# Patient Record
Sex: Male | Born: 1935 | Race: White | Hispanic: No | Marital: Single | Smoking: Former smoker
Health system: Southern US, Community
[De-identification: ages and names within clinical notes are randomized; demographics above are authoritative.]

## PROBLEM LIST (undated history)

## (undated) ENCOUNTER — Emergency Department (HOSPITAL_COMMUNITY): Discharge status: Absent without leave | Payer: Self-pay

## (undated) DIAGNOSIS — N400 Enlarged prostate without lower urinary tract symptoms: Secondary | ICD-10-CM

---

## 2015-05-09 ENCOUNTER — Ambulatory Visit: Payer: Self-pay | Admitting: Family Medicine

## 2016-01-24 ENCOUNTER — Encounter (HOSPITAL_COMMUNITY): Payer: Self-pay | Admitting: Emergency Medicine

## 2016-01-24 ENCOUNTER — Emergency Department (HOSPITAL_COMMUNITY)
Admission: EM | Admit: 2016-01-24 | Discharge: 2016-01-24 | Disposition: A | Discharge status: Home / Self Care | Payer: Medicare Other | Attending: Emergency Medicine | Admitting: Emergency Medicine

## 2016-01-24 ENCOUNTER — Emergency Department (HOSPITAL_COMMUNITY): Payer: Medicare Other

## 2016-01-24 DIAGNOSIS — W1839XA Other fall on same level, initial encounter: Secondary | ICD-10-CM | POA: Insufficient documentation

## 2016-01-24 DIAGNOSIS — Z791 Long term (current) use of non-steroidal anti-inflammatories (NSAID): Secondary | ICD-10-CM | POA: Insufficient documentation

## 2016-01-24 DIAGNOSIS — S22080A Wedge compression fracture of T11-T12 vertebra, initial encounter for closed fracture: Secondary | ICD-10-CM | POA: Diagnosis not present

## 2016-01-24 DIAGNOSIS — F17200 Nicotine dependence, unspecified, uncomplicated: Secondary | ICD-10-CM | POA: Diagnosis not present

## 2016-01-24 DIAGNOSIS — Y939 Activity, unspecified: Secondary | ICD-10-CM | POA: Insufficient documentation

## 2016-01-24 DIAGNOSIS — Y929 Unspecified place or not applicable: Secondary | ICD-10-CM | POA: Insufficient documentation

## 2016-01-24 DIAGNOSIS — S32010A Wedge compression fracture of first lumbar vertebra, initial encounter for closed fracture: Secondary | ICD-10-CM | POA: Diagnosis not present

## 2016-01-24 DIAGNOSIS — Y999 Unspecified external cause status: Secondary | ICD-10-CM | POA: Diagnosis not present

## 2016-01-24 DIAGNOSIS — M545 Low back pain, unspecified: Secondary | ICD-10-CM

## 2016-01-24 DIAGNOSIS — Z79899 Other long term (current) drug therapy: Secondary | ICD-10-CM | POA: Insufficient documentation

## 2016-01-24 DIAGNOSIS — IMO0002 Reserved for concepts with insufficient information to code with codable children: Secondary | ICD-10-CM

## 2016-01-24 HISTORY — DX: Benign prostatic hyperplasia without lower urinary tract symptoms: N40.0

## 2016-01-24 LAB — URINE MICROSCOPIC-ADD ON
BACTERIA UA: NONE SEEN
RBC / HPF: NONE SEEN RBC/hpf (ref 0–5)
Squamous Epithelial / LPF: NONE SEEN

## 2016-01-24 LAB — URINALYSIS, ROUTINE W REFLEX MICROSCOPIC
Bilirubin Urine: NEGATIVE
GLUCOSE, UA: NEGATIVE mg/dL
HGB URINE DIPSTICK: NEGATIVE
Ketones, ur: NEGATIVE mg/dL
Nitrite: NEGATIVE
PH: 6 (ref 5.0–8.0)
PROTEIN: NEGATIVE mg/dL
Specific Gravity, Urine: 1.015 (ref 1.005–1.030)

## 2016-01-24 MED ORDER — TRAMADOL HCL 50 MG PO TABS
50.0000 mg | ORAL_TABLET | Freq: Once | ORAL | Status: AC
Start: 2016-01-24 — End: 2016-01-24
  Administered 2016-01-24: 50 mg via ORAL
  Filled 2016-01-24: qty 1

## 2016-01-24 MED ORDER — IBUPROFEN 800 MG PO TABS: 800.0000 mg | ORAL_TABLET | Freq: Three times a day (TID) | ORAL | Status: DC

## 2016-01-24 NOTE — Discharge Instructions (Signed)

## 2016-01-24 NOTE — ED Notes (Signed)
MD at bedside. 

## 2016-01-24 NOTE — ED Notes (Signed)
PT c/o lower back pain and states "I think it's my kidneys." PT states he take flomax and denies any urinary symptoms.

## 2016-01-24 NOTE — ED Provider Notes (Signed)
CSN: [id number]     Arrival date & time 01/24/16  [id number] History  By signing my name below, I, Nicole Kindred, attest that this documentation has been prepared under the direction and in the presence of Noemi Chapel, MD.   Electronically Signed: Nicole Kindred, ED Scribe. 01/24/2016. 10:31 AM   Chief Complaint  Patient presents with  . Back Pain    The history is provided by the patient. No language interpreter was used.   HPI Comments: Tyler Wells is a 81 y.o. male who presents to the Emergency Department complaining of gradual onset, lower back pain, ongoing for three days. Pt states his pain has some minimal radiation to the L abdomen - but only with movement. Pt reports that he fell and landed backwards last week. No other associated symptoms noted. His pain is worse at night when he lays down on his back to go to sleep. And seems worse with change in position - he has never had back pain before. No other worsening or alleviating factors noted. Pt denies numbness, tingling, weakness, dysuria, difficulty urinating, hx of cancer, cough, fever, chills, shortness of breath, headache, hematochezia, constipation, or any other pertinent symptoms.   Past Medical History  Diagnosis Date  . Enlarged prostate    History reviewed. No pertinent past surgical history. History reviewed. No pertinent family history. Social History  Substance Use Topics  . Smoking status: Current Every Day Smoker -- 0.10 packs/day  . Smokeless tobacco: None  . Alcohol Use: No    Review of Systems  Constitutional: Negative for fever and chills.  Respiratory: Negative for cough and shortness of breath.   Gastrointestinal: Negative for constipation and blood in stool.  Genitourinary: Negative for dysuria and difficulty urinating.  Musculoskeletal: Positive for back pain.  Neurological: Negative for weakness, numbness and headaches.  All other systems reviewed and are negative.    Allergies  Review  of patient's allergies indicates no known allergies.  Home Medications   Prior to Admission medications   Medication Sig Start Date End Date Taking? Authorizing Provider  omeprazole (PRILOSEC) 20 MG capsule Take 20 mg by mouth daily.  01/22/16  Yes Historical Provider, MD  tamsulosin (FLOMAX) 0.4 MG CAPS capsule Take 0.4 mg by mouth daily. 12/05/15  Yes Historical Provider, MD  tiZANidine (ZANAFLEX) 4 MG tablet Take 4 mg by mouth 3 (three) times daily.  11/20/15 11/19/16 Yes Historical Provider, MD  traMADol (ULTRAM) 50 MG tablet Take 50 mg by mouth 2 (two) times daily.  01/22/16  Yes Historical Provider, MD  ibuprofen (ADVIL,MOTRIN) 800 MG tablet Take 1 tablet (800 mg total) by mouth 3 (three) times daily. 01/24/16   Noemi Chapel, MD   BP 125/72 mmHg  Pulse 97  Temp(Src) 98.2 F (36.8 C) (Oral)  Resp 16  Ht 5\' 6"  (1.676 m)  Wt 156 lb (70.761 kg)  BMI 25.19 kg/m2  SpO2 97% Physical Exam  Constitutional: He appears well-developed and well-nourished. No distress.  HENT:  Head: Normocephalic and atraumatic.  Mouth/Throat: Oropharynx is clear and moist. No oropharyngeal exudate.  Eyes: Conjunctivae and EOM are normal. Pupils are equal, round, and reactive to light. Right eye exhibits no discharge. Left eye exhibits no discharge. No scleral icterus.  Neck: Normal range of motion. Neck supple. No JVD present. No thyromegaly present.  Cardiovascular: Normal rate, regular rhythm, normal heart sounds and intact distal pulses.  Exam reveals no gallop and no friction rub.   No murmur heard. Pulmonary/Chest: Effort normal and breath  sounds normal. No respiratory distress. He has no wheezes. He has no rales.  Abdominal: Soft. Bowel sounds are normal. He exhibits no distension and no mass. There is no tenderness.  No pulsating mass. Soft, non-tender abdomen.  Musculoskeletal: Normal range of motion. He exhibits tenderness ( no spinal ttp - has bilateral paraspinal ttp in the lumbar spine.). He exhibits  no edema.  Lymphadenopathy:    He has no cervical adenopathy.  Neurological: He is alert. Coordination normal.  Has normal gait, normal strength of the bilateral UE and LE's and no ataxia, speech is normal.  Skin: Skin is warm and dry. No rash noted. No erythema.  Psychiatric: He has a normal mood and affect. His behavior is normal.  Nursing note and vitals reviewed.   ED Course  Procedures (including critical care time) DIAGNOSTIC STUDIES: Oxygen Saturation is 97% on RA, normal by my interpretation.    COORDINATION OF CARE: 8:58 AM Discussed treatment plan with pt at bedside and pt agreed to plan.   Labs Review Labs Reviewed  URINALYSIS, ROUTINE W REFLEX MICROSCOPIC (NOT AT [facility]) - Abnormal; Notable for the following:    Leukocytes, UA TRACE (*)    All other components within normal limits  URINE CULTURE  URINE MICROSCOPIC-ADD ON    Imaging Review Dg Lumbar Spine Complete  01/24/2016  CLINICAL DATA:  Patient with low back pain.  No known injury. EXAM: LUMBAR SPINE - COMPLETE 4+ VIEW COMPARISON:  None. FINDINGS: Mild rightward curvature of the lumbar spine. Age-indeterminate height loss of the superior endplate of the 624THL and L1 vertebral bodies. Relative preservation the intervertebral disc space heights. L5-S1 degenerative disc disease. L4-5 and L5-S1 facet degenerative changes. Aortic vascular calcifications. SI joints are unremarkable. IMPRESSION: Age-indeterminate height loss of the superior endplate of the 624THL and L1 vertebral bodies. Recommend correlation point tenderness. Lower lumbar spine facet and disc degenerative changes. Electronically Signed   By: Lovey Newcomer M.D.   On: 01/24/2016 09:28   I have personally reviewed and evaluated these images and lab results as part of my medical decision-making.    MDM   Final diagnoses:  Bilateral low back pain without sciatica  Compression fracture   The pt appears overal well, he has a recent fall and now has ttp in the  paraspinal area without midline ttp - however, he has had a recent fall raising concern for possible frx of the lumbar spine - will check L spine series and UA - no signs of AAA, pain is reproducible with movement and position raising suspician for muskuloskeletal source of pain rather than neurological / or intraabdominal.  UA also ordered.  Possible endplate frx / [id number]-GRM, otherwise normal xray UA neg Pt and family informed  I personally performed the services described in this documentation, which was scribed in my presence. The recorded information has been reviewed and is accurate.     Meds given in ED:  Medications  traMADol (ULTRAM) tablet 50 mg (50 mg Oral Given 01/24/16 0922)    New Prescriptions   IBUPROFEN (ADVIL,MOTRIN) 800 MG TABLET    Take 1 tablet (800 mg total) by mouth 3 (three) times daily.          Noemi Chapel, MD 01/24/16 1031

## 2016-01-25 LAB — URINE CULTURE: Culture: NO GROWTH

## 2017-02-26 ENCOUNTER — Encounter (HOSPITAL_COMMUNITY): Payer: Self-pay

## 2017-02-26 ENCOUNTER — Emergency Department (HOSPITAL_COMMUNITY): Payer: Medicare Other

## 2017-02-26 ENCOUNTER — Emergency Department (HOSPITAL_COMMUNITY)
Admission: EM | Admit: 2017-02-26 | Discharge: 2017-02-26 | Disposition: A | Discharge status: Home / Self Care | Payer: Medicare Other | Attending: Emergency Medicine | Admitting: Emergency Medicine

## 2017-02-26 DIAGNOSIS — Z79899 Other long term (current) drug therapy: Secondary | ICD-10-CM | POA: Diagnosis not present

## 2017-02-26 DIAGNOSIS — M25512 Pain in left shoulder: Secondary | ICD-10-CM | POA: Diagnosis present

## 2017-02-26 DIAGNOSIS — M25511 Pain in right shoulder: Secondary | ICD-10-CM | POA: Insufficient documentation

## 2017-02-26 DIAGNOSIS — M19012 Primary osteoarthritis, left shoulder: Secondary | ICD-10-CM

## 2017-02-26 DIAGNOSIS — F17210 Nicotine dependence, cigarettes, uncomplicated: Secondary | ICD-10-CM | POA: Diagnosis not present

## 2017-02-26 MED ORDER — HYDROCODONE-ACETAMINOPHEN 5-325 MG PO TABS: ORAL_TABLET | ORAL | 0 refills | Status: DC

## 2017-02-26 MED ORDER — DEXAMETHASONE SODIUM PHOSPHATE 4 MG/ML IJ SOLN
8.0000 mg | Freq: Once | INTRAMUSCULAR | Status: AC
  Administered 2017-02-26: 8 mg via INTRAMUSCULAR
  Filled 2017-02-26: qty 2

## 2017-02-26 NOTE — ED Triage Notes (Signed)
Pt reports bilateral shoulder pain, shoulders hurt all the time. Reports he has difficulty picking up objects. No injury

## 2017-02-26 NOTE — Discharge Instructions (Signed)
Your vital signs are within normal limits. Your examination suggest arthritis related pain in the right shoulder. You were treated in the emergency department with a Decadron shot. Please use Tylenol for mild pain, may use Norco at bedtime for more severe pain. This medication may cause drowsiness, please use it with caution. Please see Dr. Aline Brochure for orthopedic evaluation and management of your shoulder.

## 2017-02-26 NOTE — ED Provider Notes (Signed)
[facility] DEPT Provider Note   CSN: [id number] Arrival date & time: 02/26/17  1305     History   Chief Complaint Chief Complaint  Patient presents with  . Shoulder Pain    HPI Tyler Wells is a 82 y.o. male.  Patient is an 82 year old male who presents to the emergency department with a complaint of shoulder pain.  The patient complains of pain in both shoulders. This started after a fall about 3 or 4 weeks ago. The patient states that he had x-rays done and there was no fracture at that time. He states however he's been having increasing problems with the shoulders. Most recently he has difficulty with picking up of areas objects and with raising his right greater than his left arm. He has not noted any hot joints appreciated. He's not had any more falls after the one that was 3 or 4 weeks ago. He's not had any unusual weakness of any one side. He presents now because he states that the pain is gradually getting worse. He particularly notices problems at bedtime.      Past Medical History:  Diagnosis Date  . Enlarged prostate     There are no active problems to display for this patient.   History reviewed. No pertinent surgical history.     Home Medications    Prior to Admission medications   Medication Sig Start Date End Date Taking? Authorizing Provider  omeprazole (PRILOSEC) 20 MG capsule Take 1 capsule by mouth daily. 08/26/16  Yes [provider]  tamsulosin (FLOMAX) 0.4 MG CAPS capsule Take 0.4 mg by mouth daily. 12/05/15  Yes [provider]  HYDROcodone-acetaminophen (NORCO/VICODIN) 5-325 MG tablet 1 po at hs, or q6h prn. 02/26/17   Lily Kocher, PA-C    Family History No family history on file.  Social History Social History  Substance Use Topics  . Smoking status: Current Every Day Smoker    Packs/day: 0.10  . Smokeless tobacco: Never Used  . Alcohol use No     Allergies   Patient has no known allergies.   Review of  Systems Review of Systems  Constitutional: Negative for activity change.       All ROS Neg except as noted in HPI  HENT: Negative for nosebleeds.   Eyes: Negative for photophobia and discharge.  Respiratory: Negative for cough, shortness of breath and wheezing.   Cardiovascular: Negative for chest pain and palpitations.  Gastrointestinal: Negative for abdominal pain and blood in stool.  Genitourinary: Negative for dysuria, frequency and hematuria.  Musculoskeletal: Positive for arthralgias and back pain. Negative for neck pain.  Skin: Negative.   Neurological: Negative for dizziness, seizures and speech difficulty.  Psychiatric/Behavioral: Negative for confusion and hallucinations.     Physical Exam Updated Vital Signs BP (!) 146/61 (BP Location: Right Arm)   Pulse 95   Temp (!) 97.3 F (36.3 C) (Oral)   Resp 18   Wt 70.8 kg (156 lb)   SpO2 100%   BMI 25.18 kg/m   Physical Exam  Constitutional: He is oriented to person, place, and time. He appears well-developed and well-nourished.  Non-toxic appearance.  HENT:  Head: Normocephalic.  Right Ear: Tympanic membrane and external ear normal.  Left Ear: Tympanic membrane and external ear normal.  Eyes: EOM and lids are normal. Pupils are equal, round, and reactive to light.  Neck: Normal range of motion. Neck supple. Carotid bruit is not present.  Cardiovascular: Normal rate, regular rhythm, intact distal pulses and  normal pulses.   Murmur heard.  Systolic murmur is present with a grade of 2/6  Pulmonary/Chest: Breath sounds normal. No respiratory distress.  Abdominal: Soft. Bowel sounds are normal. There is no tenderness. There is no guarding.  Musculoskeletal:       Right shoulder: He exhibits decreased range of motion, crepitus and pain. He exhibits no deformity.       Left shoulder: He exhibits tenderness and crepitus.  Lymphadenopathy:       Head (right side): No submandibular adenopathy present.       Head (left side):  No submandibular adenopathy present.    He has no cervical adenopathy.  Neurological: He is alert and oriented to person, place, and time. He has normal strength. No cranial nerve deficit or sensory deficit.  Skin: Skin is warm and dry.  Psychiatric: He has a normal mood and affect. His speech is normal.  Nursing note and vitals reviewed.    ED Treatments / Results  Labs (all labs ordered are listed, but only abnormal results are displayed) Labs Reviewed - No data to display  EKG  EKG Interpretation None       Radiology No results found.  Procedures Procedures (including critical care time)  Medications Ordered in ED Medications  dexamethasone (DECADRON) injection 8 mg (not administered)     Initial Impression / Assessment and Plan / ED Course  I have reviewed the triage vital signs and the nursing notes.  Pertinent labs & imaging results that were available during my care of the patient were reviewed by me and considered in my medical decision making (see chart for details).       Final Clinical Impressions(s) / ED Diagnoses MDM Vital signs reviewed.  X-ray of the right shoulder show some glenohumeral degenerative changes but no acute fracture or dislocation.  Suspect the patient has exacerbation of the arthritis involving the right shoulder greater than the left. The patient is asked to use Tylenol for mild pain. He is given a prescription for Norco for more severe pain. Have advised the patient to use caution when taking the Norco. He will follow-up with his primary physician in the office.    Final diagnoses:  Acute pain of right shoulder  Primary osteoarthritis of left shoulder    New Prescriptions New Prescriptions   HYDROCODONE-ACETAMINOPHEN (NORCO/VICODIN) 5-325 MG TABLET    1 po at hs, or q6h prn.     Lily Kocher, PA-C 02/26/17 1514    Julianne Rice, MD 03/01/17 [phone]

## 2017-03-16 ENCOUNTER — Encounter: Payer: Self-pay | Admitting: Orthopedic Surgery

## 2017-03-16 ENCOUNTER — Ambulatory Visit (INDEPENDENT_AMBULATORY_CARE_PROVIDER_SITE_OTHER): Payer: Medicare Other | Admitting: Orthopedic Surgery

## 2017-03-16 VITALS — BP 143/76 | HR 57 | Wt 159.0 lb

## 2017-03-16 DIAGNOSIS — M7552 Bursitis of left shoulder: Secondary | ICD-10-CM | POA: Diagnosis not present

## 2017-03-16 DIAGNOSIS — M7551 Bursitis of right shoulder: Secondary | ICD-10-CM

## 2017-03-16 MED ORDER — NAPROXEN 500 MG PO TABS: 500.0000 mg | ORAL_TABLET | Freq: Two times a day (BID) | ORAL | 2 refills | Status: DC

## 2017-03-16 NOTE — Patient Instructions (Signed)

## 2017-03-16 NOTE — Progress Notes (Signed)
NEW PATIENT OFFICE VISIT    Chief Complaint  Patient presents with  . Shoulder Pain    82 year old male presents for evaluation of bilateral shoulder pain right greater than left  The patient gives a one-month history of progressive pain right greater than left shoulder but pain in both shoulders. 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 When he rolls onto his right side wakes him up from sleep. He describes a dull constant aching. Acromial pain in both shoulders    Review of Systems  Constitutional: Negative for fever.  Respiratory: Negative for shortness of breath.   Cardiovascular: Positive for chest pain.  Skin: Negative.   Neurological: Negative for tingling and sensory change.     Past Medical History:  Diagnosis Date  . Enlarged prostate     No past surgical history on file.  No family history on file. Social History  Substance Use Topics  . Smoking status: Current Every Day Smoker    Packs/day: 0.10  . Smokeless tobacco: Never Used  . Alcohol use No    BP (!) 143/76   Pulse (!) 57   Wt 159 lb (72.1 kg)   BMI 25.66 kg/m   Physical Exam  Constitutional: He is oriented to person, place, and time. He appears well-developed and well-nourished.  Vital signs have been reviewed and are stable. Gen. appearance the patient is well-developed and well-nourished with normal grooming and hygiene.   Musculoskeletal:  Gait is normal  Neurological: He is alert and oriented to person, place, and time.  Skin: Skin is warm and dry. No erythema.  Psychiatric: He has a normal mood and affect.  Vitals reviewed.   Left Shoulder Exam   Tenderness  The patient is experiencing tenderness in the [facility] acromial tenderness.  Range of Motion  Active Abduction:                       90 Passive Abduction:                    110 Forward Flexion:                         150  Muscle Strength  Normal left shoulder strength Supraspinatus:     5/5  Tests  Impingement:   Negative Drop Arm:        Negative Apprehension: Negative Sulcus:            Negative  Right Shoulder Exam   Tenderness  The patient is experiencing tenderness in the Peri-acromial tenderness.  Range of Motion  Active Abduction:                       90 Passive Abduction:                    110 Forward Flexion:                        150  Muscle Strength  Normal right shoulder strength Supraspinatus:     5/5  Tests  Impingement:   Positive Drop Arm:        Negative Apprehension: Negative Sulcus:            Negative  Comments:  Neurovascular exam intact    X-ray  AP lateral and axillary x-ray right shoulder I see that this does show mild degenerative arthritis. No fracture dislocation.  Report was read as follows IMPRESSION: No evidence of acute abnormality.     Electronically Signed   By: Margarette Canada M.D.   On: 02/26/2017 14:49  Encounter Diagnosis  Name Primary?  . Bilateral shoulder bursitis Yes    PLAN:   Meds ordered this encounter  Medications  . naproxen (NAPROSYN) 500 MG tablet    Sig: Take 1 tablet (500 mg total) by mouth 2 (two) times daily with a meal.    Dispense:  60 tablet    Refill:  2   Procedure note the subacromial injection shoulder Left  Verbal consent was obtained to inject the  left  Shoulder  Timeout was completed to confirm the injection site is a subacromial space of the  left shoulder   Medication used Depo-Medrol 40 mg and lidocaine 1% 3 cc  Anesthesia was provided by ethyl chloride  The injection was performed in the left posterior subacromial space. After pinning the skin with alcohol and anesthetized the skin with ethyl chloride the subacromial space was injected using a 20-gauge needle. There were no complications  Sterile dressing was applied.  I then repeated the exact procedure on the right shoulder. 40 mg of  Depo-Medrol and 1% lidocaine 3 mL  .

## 2017-11-04 ENCOUNTER — Other Ambulatory Visit: Payer: Self-pay

## 2017-11-04 ENCOUNTER — Emergency Department (HOSPITAL_COMMUNITY): Payer: Medicare Other

## 2017-11-04 ENCOUNTER — Encounter (HOSPITAL_COMMUNITY): Payer: Self-pay | Admitting: Emergency Medicine

## 2017-11-04 ENCOUNTER — Emergency Department (HOSPITAL_COMMUNITY)
Admission: EM | Admit: 2017-11-04 | Discharge: 2017-11-04 | Disposition: A | Discharge status: Home / Self Care | Payer: Medicare Other | Attending: Emergency Medicine | Admitting: Emergency Medicine

## 2017-11-04 DIAGNOSIS — S2242XA Multiple fractures of ribs, left side, initial encounter for closed fracture: Secondary | ICD-10-CM | POA: Insufficient documentation

## 2017-11-04 DIAGNOSIS — S299XXA Unspecified injury of thorax, initial encounter: Secondary | ICD-10-CM | POA: Diagnosis present

## 2017-11-04 DIAGNOSIS — Y929 Unspecified place or not applicable: Secondary | ICD-10-CM | POA: Insufficient documentation

## 2017-11-04 DIAGNOSIS — Z87891 Personal history of nicotine dependence: Secondary | ICD-10-CM | POA: Diagnosis not present

## 2017-11-04 DIAGNOSIS — Y939 Activity, unspecified: Secondary | ICD-10-CM | POA: Diagnosis not present

## 2017-11-04 DIAGNOSIS — Y998 Other external cause status: Secondary | ICD-10-CM | POA: Insufficient documentation

## 2017-11-04 DIAGNOSIS — W109XXA Fall (on) (from) unspecified stairs and steps, initial encounter: Secondary | ICD-10-CM | POA: Diagnosis not present

## 2017-11-04 MED ORDER — HYDROCODONE-ACETAMINOPHEN 5-325 MG PO TABS
1.0000 | ORAL_TABLET | Freq: Once | ORAL | Status: AC
  Administered 2017-11-04: 1 via ORAL
  Filled 2017-11-04: qty 1

## 2017-11-04 MED ORDER — HYDROCODONE-ACETAMINOPHEN 7.5-325 MG PO TABS: 1.0000 | ORAL_TABLET | Freq: Four times a day (QID) | ORAL | 0 refills | Status: DC | PRN

## 2017-11-04 NOTE — ED Triage Notes (Signed)
Pt states he fell a couple weeks ago and is having L sided rib pain. Denies being seeing for this before now, no blood thinner use.

## 2017-11-04 NOTE — Discharge Instructions (Signed)
Use a pillow to hold firm pressure to cough and take deep breaths throughout the day.  Call Dr. Juel Burrow office to arrange a follow-up appt for next week.  Return here for any worsening symptoms such as coughing up blood, sudden shortness of breath or increasing pain

## 2017-11-04 NOTE — ED Provider Notes (Signed)
[facility] EMERGENCY DEPARTMENT Provider Note   CSN: [id number] Arrival date & time: 11/04/17  1212     History   Chief Complaint Chief Complaint  Patient presents with  . rib pain    HPI Tyler Wells is a 83 y.o. male.  HPI   Tyler Wells is a 83 y.o. male who presents to the Emergency Department complaining of left rib pain for 2 weeks.  Patient states that he was drinking alcohol and fell down two steps landing on his left chest.  He describes a sharp pain to his chest associated with deep breathing, laughing and coughing.  Pain improves somewhat at rest.  He has taken tylenol and ibuprofen without improvement.  He denies shortness of breath, abdominal pain, flank pain, head injury , neck or back pain and LOC.  Denies use of blood thinners   Past Medical History:  Diagnosis Date  . Enlarged prostate     There are no active problems to display for this patient.   History reviewed. No pertinent surgical history.   Home Medications    Prior to Admission medications   Medication Sig Start Date End Date Taking? Authorizing Provider  HYDROcodone-acetaminophen (NORCO) 7.5-325 MG tablet Take 1 tablet by mouth every 6 (six) hours as needed for moderate pain. 11/04/17   Elie Gragert, PA-C  naproxen (NAPROSYN) 500 MG tablet Take 1 tablet (500 mg total) by mouth 2 (two) times daily with a meal. 03/16/17   Carole Civil, MD  tamsulosin (FLOMAX) 0.4 MG CAPS capsule Take 0.4 mg by mouth daily. 12/05/15   [provider]    Family History History reviewed. No pertinent family history.  Social History Social History   Tobacco Use  . Smoking status: Former Smoker    Packs/day: 0.10  . Smokeless tobacco: Never Used  Substance Use Topics  . Alcohol use: No  . Drug use: No     Allergies   Patient has no known allergies.   Review of Systems Review of Systems  Constitutional: Negative for chills and fever.  Respiratory: Negative for cough,  chest tightness and shortness of breath.   Cardiovascular: Positive for chest pain (left rib pain).  Gastrointestinal: Negative for abdominal pain, diarrhea, nausea and vomiting.  Genitourinary: Negative for difficulty urinating, dysuria and hematuria.  Musculoskeletal: Negative for arthralgias, back pain, joint swelling and neck pain.  Skin: Negative for color change and wound.  Neurological: Negative for dizziness, syncope, weakness and headaches.  All other systems reviewed and are negative.    Physical Exam Updated Vital Signs BP 128/65 (BP Location: Right Arm)   Pulse 82   Temp 97.7 F (36.5 C) (Oral)   Resp 16   Ht 5\' 7"  (1.702 m)   Wt 73.9 kg (163 lb)   SpO2 95%   BMI 25.53 kg/m   Physical Exam  Constitutional: He is oriented to person, place, and time. He appears well-developed and well-nourished. No distress.  HENT:  Head: Atraumatic.  Mouth/Throat: Oropharynx is clear and moist.  Eyes: EOM are normal. Pupils are equal, round, and reactive to light.  Neck: Normal range of motion. Neck supple.  Cardiovascular: Normal rate, regular rhythm and intact distal pulses.  Pulmonary/Chest: Effort normal and breath sounds normal. No respiratory distress. He exhibits tenderness (focal ttp of the lateral left ribs.  no ecchymosis or edema.  ).  Abdominal: Soft. He exhibits no distension and no mass. There is no tenderness. There is no guarding.  Musculoskeletal: Normal range  of motion.  Lymphadenopathy:    He has no cervical adenopathy.  Neurological: He is alert and oriented to person, place, and time. No sensory deficit.  Skin: Skin is warm. Capillary refill takes less than 2 seconds.  Nursing note and vitals reviewed.    ED Treatments / Results  Labs (all labs ordered are listed, but only abnormal results are displayed) Labs Reviewed - No data to display  EKG  EKG Interpretation None       Radiology Dg Ribs Unilateral W/chest Left  Result Date:  11/04/2017 CLINICAL DATA:  Left rib pain after fall EXAM: LEFT RIBS AND CHEST - 3+ VIEW COMPARISON:  None. FINDINGS: Low lung volumes. Normal heart size. Calcified granulomatous nodes in right hilum and right lower mediastinum. Otherwise normal mediastinal contour. No pneumothorax. No pleural effusion. Mild-to-moderate bibasilar atelectasis. No pulmonary edema. Mildly displaced acute lateral left fourth, fifth, sixth and seventh rib fractures, including comminuted lateral left seventh rib fractures. No suspicious focal osseous lesions. IMPRESSION: 1. Mildly displaced acute lateral left fourth through seventh rib fractures. 2. No pneumothorax. 3. Mild-to-moderate bibasilar atelectasis. Electronically Signed   By: Ilona Sorrel M.D.   On: 11/04/2017 13:12    Procedures Procedures (including critical care time)  Medications Ordered in ED Medications  HYDROcodone-acetaminophen (NORCO/VICODIN) 5-325 MG per tablet 1 tablet (1 tablet Oral Given 11/04/17 1248)     Initial Impression / Assessment and Plan / ED Course  I have reviewed the triage vital signs and the nursing notes.  Pertinent labs & imaging results that were available during my care of the patient were reviewed by me and considered in my medical decision making (see chart for details).     Pt also seen by Dr. Roderic Palau and care plan discussed.    XR shows fx's of multiple ribs w/o pneumo.  Pt is ambulatory.  No hypoxia or tachypnea.  Feeling better after po medication.  Discussed home therapies and importance of close PCP f/u.  ER return precautions also discussed.    Final Clinical Impressions(s) / ED Diagnoses   Final diagnoses:  Closed fracture of multiple ribs of left side, initial encounter    ED Discharge Orders        Ordered    HYDROcodone-acetaminophen (NORCO) 7.5-325 MG tablet  Every 6 hours PRN     11/04/17 1352       Kem Parkinson, PA-C 11/05/17 1711    Milton Ferguson, MD 11/06/17 1253

## 2017-11-04 NOTE — ED Notes (Signed)
Patient seen and evaluated by EDPa for initial assessment. 

## 2018-03-03 DIAGNOSIS — N400 Enlarged prostate without lower urinary tract symptoms: Secondary | ICD-10-CM | POA: Diagnosis not present

## 2018-03-03 DIAGNOSIS — Z6824 Body mass index (BMI) 24.0-24.9, adult: Secondary | ICD-10-CM | POA: Diagnosis not present

## 2018-03-03 DIAGNOSIS — M25511 Pain in right shoulder: Secondary | ICD-10-CM | POA: Diagnosis not present

## 2018-03-03 DIAGNOSIS — R0781 Pleurodynia: Secondary | ICD-10-CM | POA: Diagnosis not present

## 2018-03-03 DIAGNOSIS — G4700 Insomnia, unspecified: Secondary | ICD-10-CM | POA: Diagnosis not present

## 2018-04-28 DIAGNOSIS — H6123 Impacted cerumen, bilateral: Secondary | ICD-10-CM | POA: Diagnosis not present

## 2018-04-28 DIAGNOSIS — M791 Myalgia, unspecified site: Secondary | ICD-10-CM | POA: Diagnosis not present

## 2018-04-28 DIAGNOSIS — H9190 Unspecified hearing loss, unspecified ear: Secondary | ICD-10-CM | POA: Diagnosis not present

## 2018-05-14 DIAGNOSIS — M791 Myalgia, unspecified site: Secondary | ICD-10-CM | POA: Diagnosis not present

## 2018-05-14 DIAGNOSIS — Z0001 Encounter for general adult medical examination with abnormal findings: Secondary | ICD-10-CM | POA: Diagnosis not present

## 2018-05-14 DIAGNOSIS — Z7689 Persons encountering health services in other specified circumstances: Secondary | ICD-10-CM | POA: Diagnosis not present

## 2018-05-14 DIAGNOSIS — Z6824 Body mass index (BMI) 24.0-24.9, adult: Secondary | ICD-10-CM | POA: Diagnosis not present

## 2018-05-14 DIAGNOSIS — R0781 Pleurodynia: Secondary | ICD-10-CM | POA: Diagnosis not present

## 2018-05-14 DIAGNOSIS — M25511 Pain in right shoulder: Secondary | ICD-10-CM | POA: Diagnosis not present

## 2018-05-14 DIAGNOSIS — K219 Gastro-esophageal reflux disease without esophagitis: Secondary | ICD-10-CM | POA: Diagnosis not present

## 2018-05-14 DIAGNOSIS — S2232XA Fracture of one rib, left side, initial encounter for closed fracture: Secondary | ICD-10-CM | POA: Diagnosis not present

## 2018-07-06 DIAGNOSIS — M6208 Separation of muscle (nontraumatic), other site: Secondary | ICD-10-CM | POA: Diagnosis not present

## 2018-07-06 DIAGNOSIS — K210 Gastro-esophageal reflux disease with esophagitis: Secondary | ICD-10-CM | POA: Diagnosis not present

## 2018-07-06 DIAGNOSIS — M791 Myalgia, unspecified site: Secondary | ICD-10-CM | POA: Diagnosis not present

## 2018-07-06 DIAGNOSIS — H9193 Unspecified hearing loss, bilateral: Secondary | ICD-10-CM | POA: Diagnosis not present

## 2018-07-06 DIAGNOSIS — I10 Essential (primary) hypertension: Secondary | ICD-10-CM | POA: Diagnosis not present

## 2018-08-05 ENCOUNTER — Ambulatory Visit (INDEPENDENT_AMBULATORY_CARE_PROVIDER_SITE_OTHER): Payer: Medicare Other | Admitting: Otolaryngology

## 2018-11-24 DIAGNOSIS — K219 Gastro-esophageal reflux disease without esophagitis: Secondary | ICD-10-CM | POA: Diagnosis not present

## 2018-11-24 DIAGNOSIS — M19011 Primary osteoarthritis, right shoulder: Secondary | ICD-10-CM | POA: Diagnosis not present

## 2018-11-24 DIAGNOSIS — M545 Low back pain: Secondary | ICD-10-CM | POA: Diagnosis not present

## 2019-01-03 DIAGNOSIS — Z0000 Encounter for general adult medical examination without abnormal findings: Secondary | ICD-10-CM | POA: Diagnosis not present

## 2019-02-04 IMAGING — DX DG RIBS W/ CHEST 3+V*L*
4 series · 4 of 4 positions shown · non-contrast
Comparison: None.

CLINICAL DATA: Left rib pain after fall

EXAM:
LEFT RIBS AND CHEST - 3+ VIEW

[chest pa]
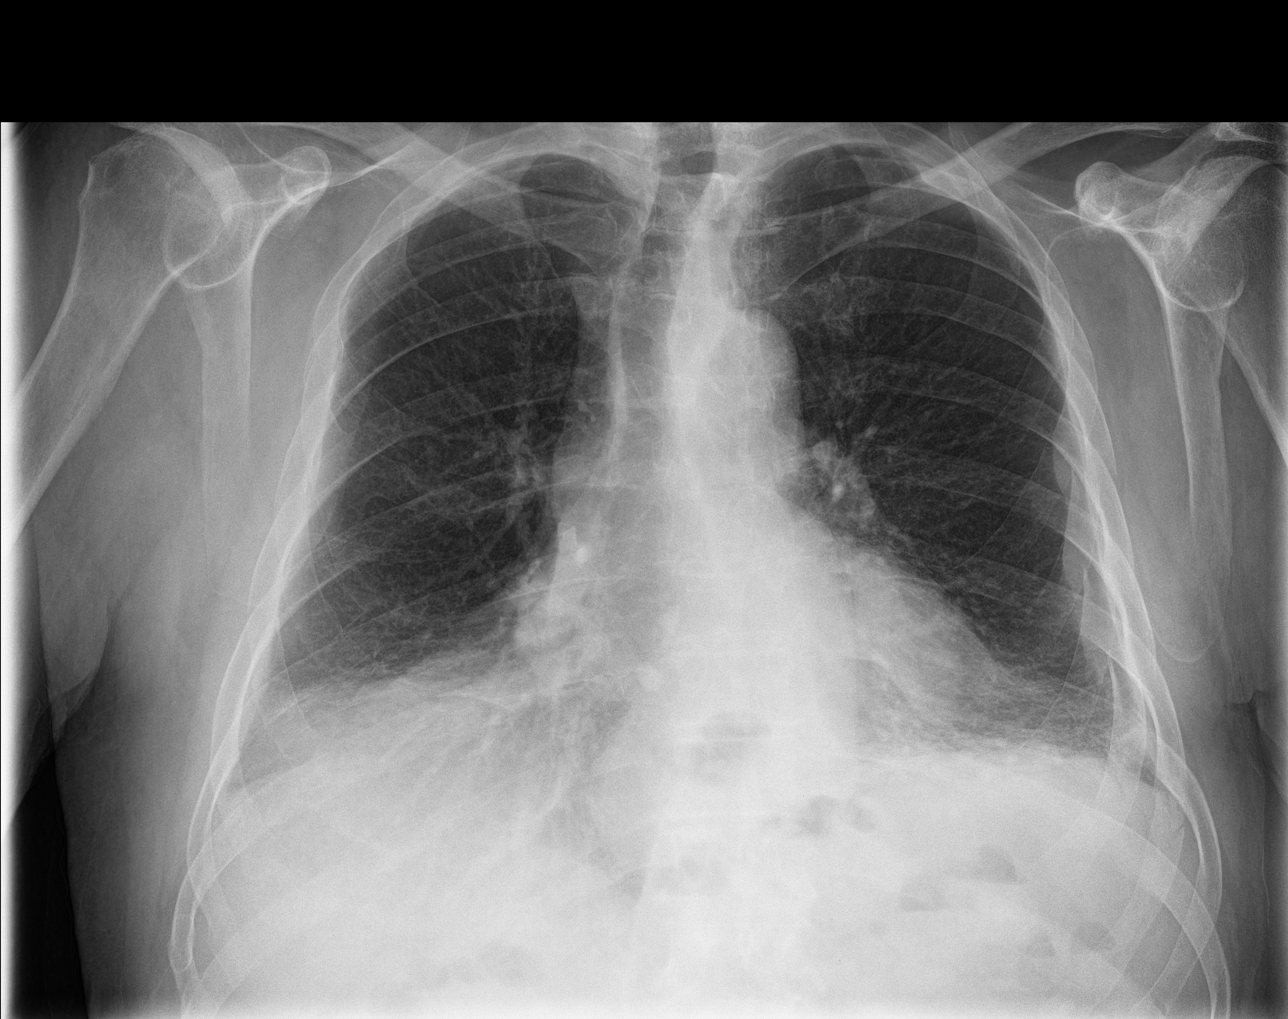

[rib obl (1 of 2)]
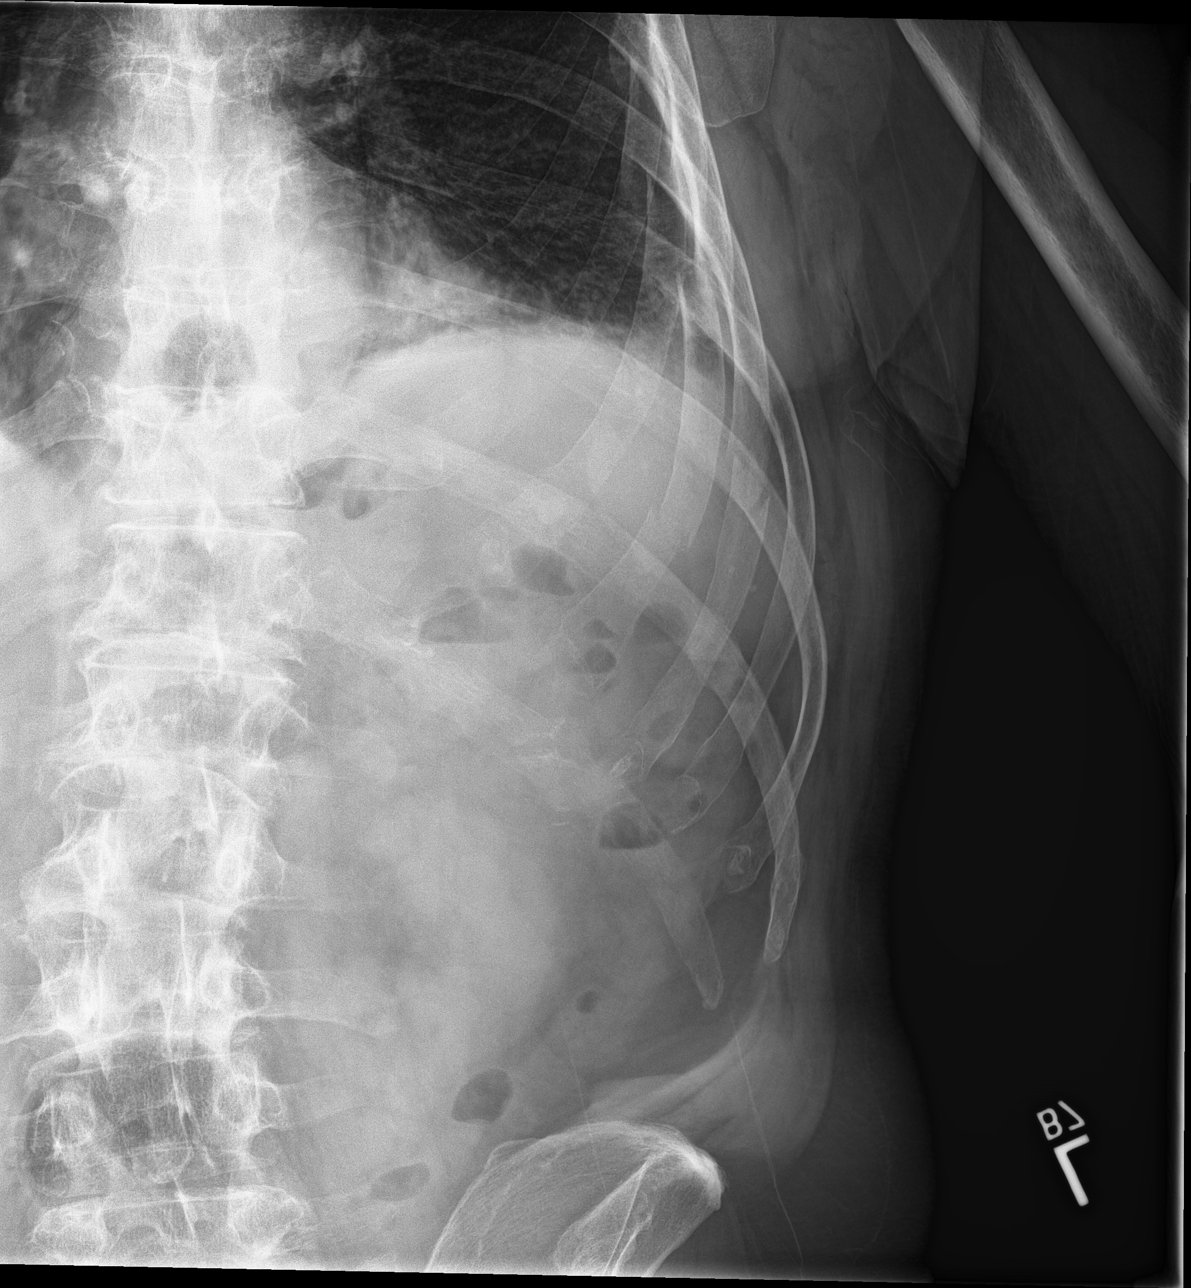

[rib obl (2 of 2)]
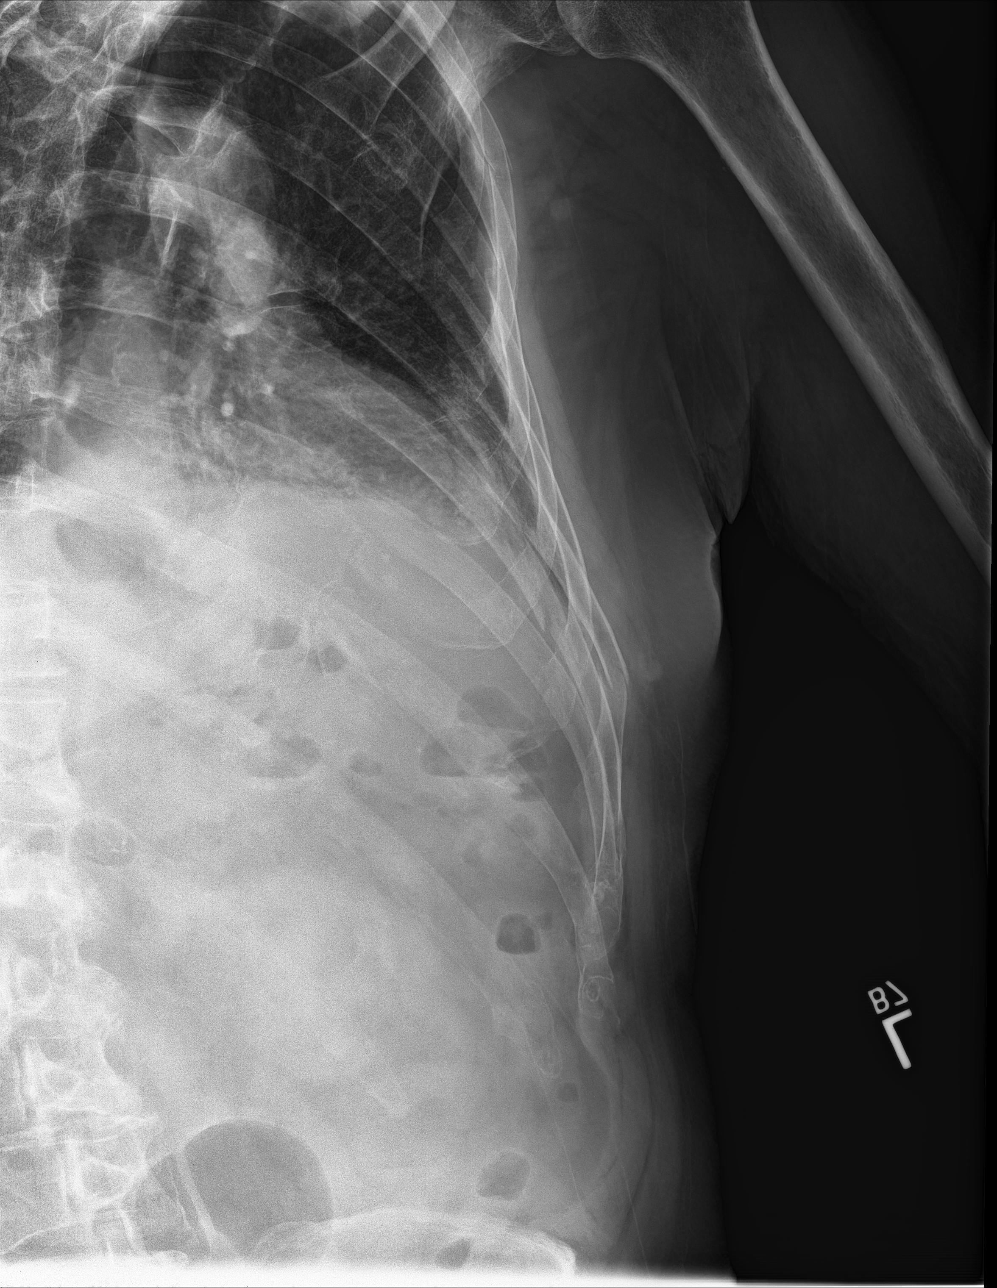

[rib pa]
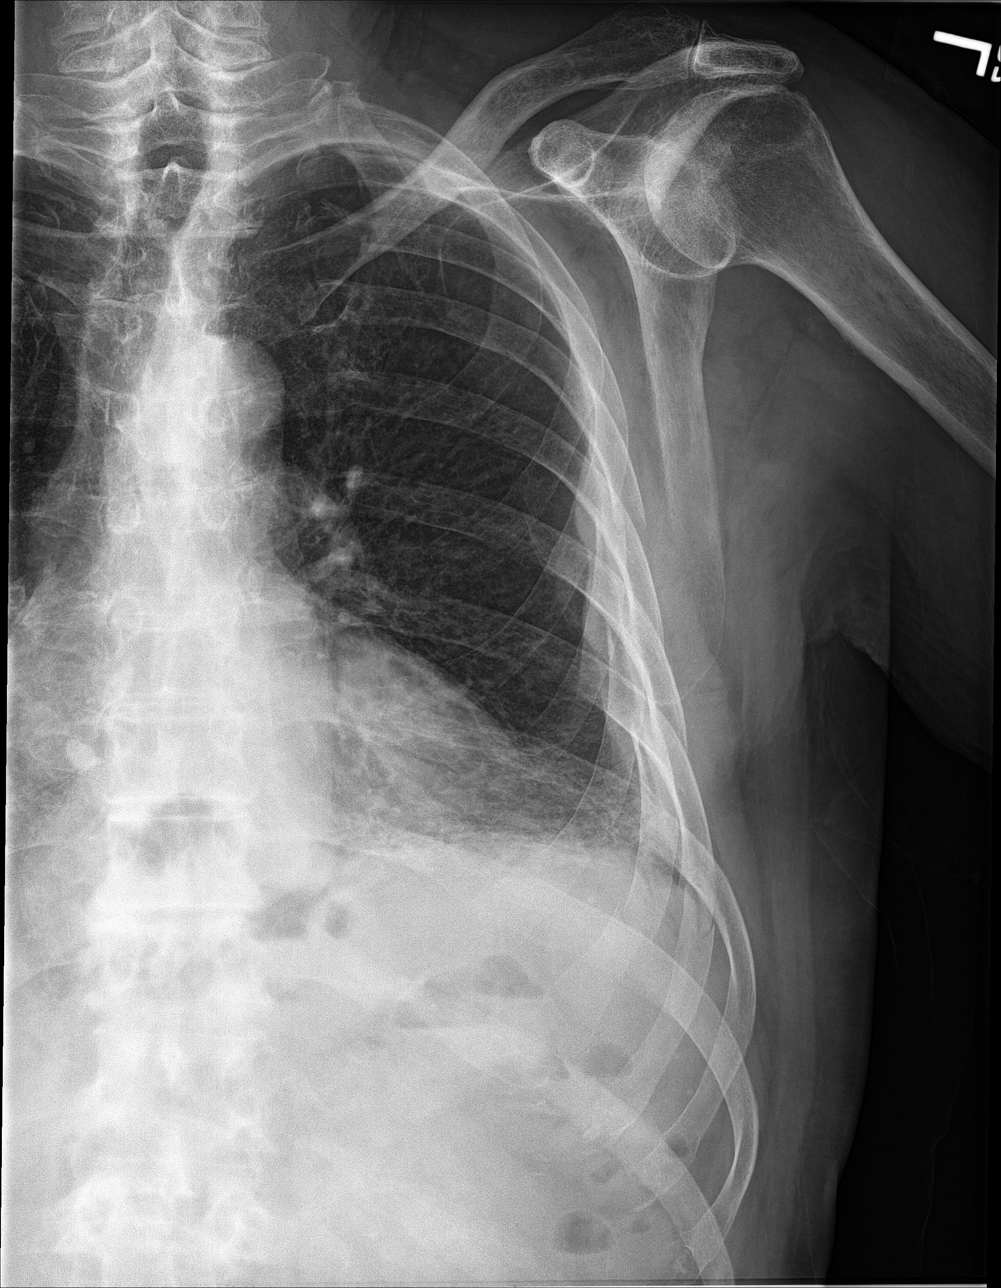

[4 of 4 positions shown; findings below may reference images not displayed]

FINDINGS: Low lung volumes. Normal heart size. Calcified granulomatous nodes
in right hilum and right lower mediastinum. Otherwise normal
mediastinal contour. No pneumothorax. No pleural effusion.
Mild-to-moderate bibasilar atelectasis. No pulmonary edema. Mildly
displaced acute lateral left fourth, fifth, sixth and seventh rib
fractures, including comminuted lateral left seventh rib fractures.
No suspicious focal osseous lesions.
IMPRESSION: 1. Mildly displaced acute lateral left fourth through seventh rib
fractures.
2. No pneumothorax.
3. Mild-to-moderate bibasilar atelectasis.

## 2019-04-10 ENCOUNTER — Other Ambulatory Visit: Payer: Self-pay

## 2019-04-10 DIAGNOSIS — R110 Nausea: Secondary | ICD-10-CM | POA: Diagnosis not present

## 2019-04-10 DIAGNOSIS — N390 Urinary tract infection, site not specified: Secondary | ICD-10-CM | POA: Diagnosis not present

## 2019-04-10 DIAGNOSIS — R001 Bradycardia, unspecified: Secondary | ICD-10-CM | POA: Diagnosis not present

## 2019-04-10 DIAGNOSIS — I491 Atrial premature depolarization: Secondary | ICD-10-CM | POA: Diagnosis not present

## 2019-04-10 DIAGNOSIS — Z87891 Personal history of nicotine dependence: Secondary | ICD-10-CM | POA: Insufficient documentation

## 2019-04-10 DIAGNOSIS — H1033 Unspecified acute conjunctivitis, bilateral: Secondary | ICD-10-CM | POA: Diagnosis not present

## 2019-04-10 DIAGNOSIS — R531 Weakness: Secondary | ICD-10-CM | POA: Diagnosis not present

## 2019-04-10 DIAGNOSIS — M791 Myalgia, unspecified site: Secondary | ICD-10-CM | POA: Diagnosis present

## 2019-04-10 DIAGNOSIS — I10 Essential (primary) hypertension: Secondary | ICD-10-CM | POA: Diagnosis not present

## 2019-04-10 NOTE — ED Notes (Signed)
Patient reports to staff he needs his medication and to be taken back to a room. Pt was informed that he needed to wait his turn to be evaluated and he would be seen by a doctor before being given meds. Pt unsatisfied with explanation.

## 2019-04-10 NOTE — ED Notes (Signed)
Tyler Wells- [phone] call for ride when pt d/c

## 2019-04-10 NOTE — ED Triage Notes (Signed)
Per EMS: Pt is coming from home. Pt reportedly woke up feeling weak and it has progressed over the day. Pt's VSS.  Pt has an 18g in the LFA and was given 4mg  of zofran.

## 2019-04-11 ENCOUNTER — Emergency Department (HOSPITAL_COMMUNITY)
Admission: EM | Admit: 2019-04-11 | Discharge: 2019-04-11 | Disposition: A | Discharge status: Home / Self Care | Payer: Medicare Other | Attending: Emergency Medicine | Admitting: Emergency Medicine

## 2019-04-11 ENCOUNTER — Other Ambulatory Visit: Payer: Self-pay

## 2019-04-11 DIAGNOSIS — N390 Urinary tract infection, site not specified: Secondary | ICD-10-CM

## 2019-04-11 DIAGNOSIS — H1033 Unspecified acute conjunctivitis, bilateral: Secondary | ICD-10-CM

## 2019-04-11 LAB — CBC WITH DIFFERENTIAL/PLATELET
Abs Immature Granulocytes: 0.02 10*3/uL (ref 0.00–0.07)
Basophils Absolute: 0 10*3/uL (ref 0.0–0.1)
Basophils Relative: 0 %
Eosinophils Absolute: 0.2 10*3/uL (ref 0.0–0.5)
Eosinophils Relative: 3 %
HCT: 43.1 % (ref 39.0–52.0)
Hemoglobin: 14.4 g/dL (ref 13.0–17.0)
Immature Granulocytes: 0 %
Lymphocytes Relative: 33 %
Lymphs Abs: 2.3 10*3/uL (ref 0.7–4.0)
MCH: 35.1 pg — ABNORMAL HIGH (ref 26.0–34.0)
MCHC: 33.4 g/dL (ref 30.0–36.0)
MCV: 105.1 fL — ABNORMAL HIGH (ref 80.0–100.0)
Monocytes Absolute: 0.6 10*3/uL (ref 0.1–1.0)
Monocytes Relative: 9 %
Neutro Abs: 3.7 10*3/uL (ref 1.7–7.7)
Neutrophils Relative %: 55 %
Platelets: 229 10*3/uL (ref 150–400)
RBC: 4.1 MIL/uL — ABNORMAL LOW (ref 4.22–5.81)
RDW: 12.2 % (ref 11.5–15.5)
WBC: 6.8 10*3/uL (ref 4.0–10.5)
nRBC: 0 % (ref 0.0–0.2)

## 2019-04-11 LAB — COMPREHENSIVE METABOLIC PANEL
ALT: 16 U/L (ref 0–44)
AST: 22 U/L (ref 15–41)
Albumin: 4.2 g/dL (ref 3.5–5.0)
Alkaline Phosphatase: 67 U/L (ref 38–126)
Anion gap: 9 (ref 5–15)
BUN: 21 mg/dL (ref 8–23)
CO2: 28 mmol/L (ref 22–32)
Calcium: 9.7 mg/dL (ref 8.9–10.3)
Chloride: 101 mmol/L (ref 98–111)
Creatinine, Ser: 1.09 mg/dL (ref 0.61–1.24)
GFR calc Af Amer: >60 mL/min (ref >60)
GFR calc non Af Amer: >60 mL/min (ref >60)
Glucose, Bld: 103 mg/dL — ABNORMAL HIGH (ref 70–99)
Potassium: 4 mmol/L (ref 3.5–5.1)
Sodium: 138 mmol/L (ref 135–145)
Total Bilirubin: 0.8 mg/dL (ref 0.3–1.2)
Total Protein: 7.8 g/dL (ref 6.5–8.1)

## 2019-04-11 LAB — URINALYSIS, ROUTINE W REFLEX MICROSCOPIC
Bilirubin Urine: NEGATIVE
Glucose, UA: NEGATIVE mg/dL
Hgb urine dipstick: NEGATIVE
Ketones, ur: NEGATIVE mg/dL
Nitrite: NEGATIVE
Protein, ur: NEGATIVE mg/dL
Specific Gravity, Urine: 1.023 (ref 1.005–1.030)
pH: 5 (ref 5.0–8.0)

## 2019-04-11 LAB — TROPONIN I (HIGH SENSITIVITY)
Troponin I (High Sensitivity): 6 ng/L (ref <18)
Troponin I (High Sensitivity): 7 ng/L (ref <18)

## 2019-04-11 LAB — LIPASE, BLOOD: Lipase: 21 U/L (ref 11–51)

## 2019-04-11 MED ORDER — SODIUM CHLORIDE 0.9 % IV BOLUS
500.0000 mL | Freq: Once | INTRAVENOUS | Status: AC
  Administered 2019-04-11: 500 mL via INTRAVENOUS

## 2019-04-11 MED ORDER — TOBRAMYCIN 0.3 % OP SOLN
1.0000 [drp] | OPHTHALMIC | Status: DC
  Administered 2019-04-11: 1 [drp] via OPHTHALMIC
  Filled 2019-04-11 (×2): qty 5

## 2019-04-11 MED ORDER — SODIUM CHLORIDE 0.9 % IV BOLUS
1000.0000 mL | Freq: Once | INTRAVENOUS | Status: AC
  Administered 2019-04-11: 1000 mL via INTRAVENOUS

## 2019-04-11 MED ORDER — CEPHALEXIN 500 MG PO CAPS: 500.0000 mg | ORAL_CAPSULE | Freq: Three times a day (TID) | ORAL | 0 refills | Status: AC

## 2019-04-11 MED ORDER — ONDANSETRON HCL 4 MG/2ML IJ SOLN
4.0000 mg | Freq: Once | INTRAMUSCULAR | Status: AC
  Administered 2019-04-11: 4 mg via INTRAVENOUS
  Filled 2019-04-11: qty 2

## 2019-04-11 MED ORDER — CEPHALEXIN 500 MG PO CAPS
500.0000 mg | ORAL_CAPSULE | Freq: Once | ORAL | Status: AC
  Administered 2019-04-11: 500 mg via ORAL
  Filled 2019-04-11: qty 1

## 2019-04-11 NOTE — ED Provider Notes (Signed)
[facility] DEPT Provider Note   CSN: [id number] Arrival date & time: 04/10/19  2137  Time seen 3:12 AM  History   Chief Complaint Chief Complaint  Patient presents with  . Generalized Body Aches    HPI Tyler Wells is a 84 y.o. male.     HPI patient states about 8 AM this morning he started feeling weak.  He had some nausea but no vomiting.  He denies fever, chills, cough, rhinorrhea, sore throat, diarrhea, chest pain, shortness of breath, headache, any new numbness or tingling of his extremities, abdominal pain.  He states he just felt weak.  He states his eyes hurt and they have been itching and burning.  He states he can see fine.  He states no one else at home is sick.  PCP Celene Squibb, MD   Past Medical History:  Diagnosis Date  . Enlarged prostate     There are no active problems to display for this patient.   No past surgical history on file.      Home Medications    Prior to Admission medications   Medication Sig Start Date End Date Taking? Authorizing Provider  gabapentin (NEURONTIN) 300 MG capsule Take 300 mg by mouth daily after breakfast.   Yes [provider]  omeprazole (PRILOSEC) 20 MG capsule Take 20 mg by mouth daily after breakfast. 04/15/18  Yes [provider]  tamsulosin (FLOMAX) 0.4 MG CAPS capsule Take 0.4 mg by mouth daily. 12/05/15  Yes [provider]  cephALEXin (KEFLEX) 500 MG capsule Take 1 capsule (500 mg total) by mouth 3 (three) times daily for 14 days. 04/11/19 04/25/19  Rolland Porter, MD    Family History No family history on file.  Social History Social History   Tobacco Use  . Smoking status: Former Smoker    Packs/day: 0.10  . Smokeless tobacco: Never Used  Substance Use Topics  . Alcohol use: No  . Drug use: No     Allergies   Patient has no known allergies.   Review of Systems Review of Systems  All other systems reviewed and are negative.     Physical Exam Updated Vital Signs BP (!) 159/94 (BP Location: Right Arm)   Pulse 78   Temp (!) 97.4 F (36.3 C) (Oral)   Resp (!) 21   Wt 73.9 kg   SpO2 97%   BMI 25.53 kg/m   Physical Exam Vitals signs and nursing note reviewed.  Constitutional:      General: He is not in acute distress.    Appearance: Normal appearance. He is well-developed. He is not ill-appearing or toxic-appearing.     Comments: Hard of hearing  HENT:     Head: Normocephalic and atraumatic.     Right Ear: External ear normal.     Left Ear: External ear normal.     Nose: Nose normal. No mucosal edema or rhinorrhea.     Mouth/Throat:     Mouth: Mucous membranes are dry.     Dentition: No dental abscesses.     Pharynx: Oropharynx is clear. No uvula swelling.  Eyes:     Extraocular Movements: Extraocular movements intact.     Pupils: Pupils are equal, round, and reactive to light.     Comments: Patient has redness around the rims of his eyelids on both sides with some mild crusting seen.  He has injected sclera of both eyes.  Neck:     Musculoskeletal: Full passive range  of motion without pain, normal range of motion and neck supple.  Cardiovascular:     Rate and Rhythm: Normal rate and regular rhythm.     Pulses: Normal pulses.     Heart sounds: Normal heart sounds. No murmur. No friction rub. No gallop.   Pulmonary:     Effort: Pulmonary effort is normal. No respiratory distress.     Breath sounds: Normal breath sounds. No wheezing, rhonchi or rales.  Chest:     Chest wall: No tenderness or crepitus.  Abdominal:     General: Bowel sounds are normal. There is no distension.     Palpations: Abdomen is soft.     Tenderness: There is no abdominal tenderness. There is no guarding or rebound.  Musculoskeletal: Normal range of motion.        General: No tenderness.     Comments: Moves all extremities well.   Skin:    General: Skin is warm and dry.     Coloration: Skin is not pale.     Findings: No  erythema or rash.  Neurological:     General: No focal deficit present.     Mental Status: He is alert and oriented to person, place, and time.     Cranial Nerves: No cranial nerve deficit.  Psychiatric:        Mood and Affect: Mood normal. Mood is not anxious.        Speech: Speech normal.        Behavior: Behavior normal.        Thought Content: Thought content normal.      ED Treatments / Results  Labs (all labs ordered are listed, but only abnormal results are displayed) Results for orders placed or performed during the hospital encounter of 04/11/19  Comprehensive metabolic panel  Result Value Ref Range   Sodium 138 135 - 145 mmol/L   Potassium 4.0 3.5 - 5.1 mmol/L   Chloride 101 98 - 111 mmol/L   CO2 28 22 - 32 mmol/L   Glucose, Bld 103 (H) 70 - 99 mg/dL   BUN 21 8 - 23 mg/dL   Creatinine, Ser 1.09 0.61 - 1.24 mg/dL   Calcium 9.7 8.9 - 10.3 mg/dL   Total Protein 7.8 6.5 - 8.1 g/dL   Albumin 4.2 3.5 - 5.0 g/dL   AST 22 15 - 41 U/L   ALT 16 0 - 44 U/L   Alkaline Phosphatase 67 38 - 126 U/L   Total Bilirubin 0.8 0.3 - 1.2 mg/dL   GFR calc non Af Amer >60 >60 mL/min   GFR calc Af Amer >60 >60 mL/min   Anion gap 9 5 - 15  CBC with Differential  Result Value Ref Range   WBC 6.8 4.0 - 10.5 K/uL   RBC 4.10 (L) 4.22 - 5.81 MIL/uL   Hemoglobin 14.4 13.0 - 17.0 g/dL   HCT 43.1 39.0 - 52.0 %   MCV 105.1 (H) 80.0 - 100.0 fL   MCH 35.1 (H) 26.0 - 34.0 pg   MCHC 33.4 30.0 - 36.0 g/dL   RDW 12.2 11.5 - 15.5 %   Platelets 229 150 - 400 K/uL   nRBC 0.0 0.0 - 0.2 %   Neutrophils Relative % 55 %   Neutro Abs 3.7 1.7 - 7.7 K/uL   Lymphocytes Relative 33 %   Lymphs Abs 2.3 0.7 - 4.0 K/uL   Monocytes Relative 9 %   Monocytes Absolute 0.6 0.1 - 1.0 K/uL   Eosinophils  Relative 3 %   Eosinophils Absolute 0.2 0.0 - 0.5 K/uL   Basophils Relative 0 %   Basophils Absolute 0.0 0.0 - 0.1 K/uL   Immature Granulocytes 0 %   Abs Immature Granulocytes 0.02 0.00 - 0.07 K/uL  Urinalysis,  Routine w reflex microscopic  Result Value Ref Range   Color, Urine YELLOW YELLOW   APPearance CLEAR CLEAR   Specific Gravity, Urine 1.023 1.005 - 1.030   pH 5.0 5.0 - 8.0   Glucose, UA NEGATIVE NEGATIVE mg/dL   Hgb urine dipstick NEGATIVE NEGATIVE   Bilirubin Urine NEGATIVE NEGATIVE   Ketones, ur NEGATIVE NEGATIVE mg/dL   Protein, ur NEGATIVE NEGATIVE mg/dL   Nitrite NEGATIVE NEGATIVE   Leukocytes,Ua TRACE (A) NEGATIVE   RBC / HPF 0-5 0 - 5 RBC/hpf   WBC, UA 6-10 0 - 5 WBC/hpf   Bacteria, UA RARE (A) NONE SEEN   Squamous Epithelial / LPF 0-5 0 - 5   Mucus PRESENT    Hyaline Casts, UA PRESENT   Lipase, blood  Result Value Ref Range   Lipase 21 11 - 51 U/L  Troponin I (High Sensitivity)  Result Value Ref Range   Troponin I (High Sensitivity) 6 <18 ng/L  Troponin I (High Sensitivity)  Result Value Ref Range   Troponin I (High Sensitivity) 7 <18 ng/L   Laboratory interpretation all normal except possible UTI, urine culture sent, elevated MCV consistent with folic acid or vitamin R74 deficiency, delta troponin is negative    EKG EKG Interpretation  Date/Time:  Monday April 11 2019 04:52:58 EDT Ventricular Rate:  73 PR Interval:    QRS Duration: 102 QT Interval:  554 QTC Calculation: 611 R Axis:   14 Text Interpretation:  Sinus rhythm Multiple ventricular premature complexes Nonspecific T abnrm, anterolateral leads Prolonged QT interval No old tracing to compare Confirmed by Rolland Porter [phone]) on 04/11/2019 4:55:29 AM   Radiology No results found.  Procedures Procedures (including critical care time)  Medications Ordered in ED Medications  cephALEXin (KEFLEX) capsule 500 mg (has no administration in time range)  tobramycin (TOBREX) 0.3 % ophthalmic solution 1 drop (has no administration in time range)  sodium chloride 0.9 % bolus 1,000 mL (0 mLs Intravenous Stopped 04/11/19 0508)  sodium chloride 0.9 % bolus 500 mL (0 mLs Intravenous Stopped 04/11/19 0508)   ondansetron (ZOFRAN) injection 4 mg (4 mg Intravenous Given 04/11/19 0402)     Initial Impression / Assessment and Plan / ED Course  I have reviewed the triage vital signs and the nursing notes.  Pertinent labs & imaging results that were available during my care of the patient were reviewed by me and considered in my medical decision making (see chart for details).     Patient appeared dehydrated, he was given IV fluids and IV nausea medication.  5:15 AM patient was rechecked, he states he is feeling good.  We discussed his test results including a possible urinary tract infection.  Patient was started on oral Keflex and he has conjunctivitis of his eyes, he was started on Tobrex.  I am waiting for his delta troponin to result and if normal he will be discharged.  Patient states he feels good enough to go home.  6:10 AM patient's delta troponin is negative.  He was discharged home.  Final Clinical Impressions(s) / ED Diagnoses   Final diagnoses:  Urinary tract infection without hematuria, site unspecified  Acute conjunctivitis of both eyes, unspecified acute conjunctivitis type    ED  Discharge Orders         Ordered    cephALEXin (KEFLEX) 500 MG capsule  3 times daily     04/11/19 0544         Plan discharge  Rolland Porter, MD, Barbette Or, MD 04/11/19 [phone]

## 2019-04-11 NOTE — ED Notes (Signed)
Pt continues to complain and wants to be seen. Patient is next to come back. Pt is ambulatory and in no active distress and is disturbing the lobby telling people he has been here for 8 hours.

## 2019-04-11 NOTE — ED Notes (Signed)
Patient stable and ambulatory at discharge. Discharge instructions reviewed with patient, including medications. Opportunity for questions and concerned allowed with none voiced.

## 2019-04-11 NOTE — Discharge Instructions (Addendum)
Use the eyedrops in both eyes every 4 hours while awake.  Take the antibiotic orally until gone.  You will need to call your primary care doctor to get your medications refilled.

## 2019-04-12 LAB — URINE CULTURE
Culture: <10000 — AB
Special Requests: NORMAL

## 2019-04-18 ENCOUNTER — Other Ambulatory Visit: Payer: Self-pay | Admitting: *Deleted

## 2019-04-18 NOTE — Patient Outreach (Signed)
[facility]) Care Management  04/18/2019  Tyler Wells 1935/07/24 [id number]    Referral received 8/21 for utilization [facility] Initial Outreach 04/18/2019  Telephone Assessment-Declined all [facility] services  RN spoke with pt verified identifiers and introduced the [facility] program. Pt indicated he was not feeling well today however declined RN reaching out to his providers. Pt states he wishes to wait a few days and he will call to make an appointment. RN verified pt will utilize the contact number provider for [facility] for transportation source if needed (pt confirmed). RN offered to contact his provider to make an appointment however pt declined. RN discussed [facility] services related to pharmacy, social worker and an available nurse however pt declined all disciplines. RN offered to receive all medications and verified pt currently has a sufficient supply and taking accordingly to there recommended dosage/prescriptions. RN further engaged and offered [facility] services for prevention measures and to assist when and where needed as he continue to recover from his recent ED visit. Pt appreciative however opt to decline all [facility] services once again. Pt verified that his symptoms of not "feeling well" today was not urgent and did not require a call to his provider's office RN offered to further assist with any of his needs at this time however pt again opt to decline indicated he was "fine".   Based upon pt's option to decline [facility] services offered to mail information on contact number for [facility] if services are considered in the future (pt receptive). Pt aware his case will be closed and provider notified of his decline for [facility] services at this time.  Raina Mina, RN Care Management Coordinator [place] Office [phone]

## 2019-08-17 DIAGNOSIS — M19011 Primary osteoarthritis, right shoulder: Secondary | ICD-10-CM | POA: Diagnosis not present

## 2019-08-17 DIAGNOSIS — M25512 Pain in left shoulder: Secondary | ICD-10-CM | POA: Diagnosis not present

## 2020-01-09 DIAGNOSIS — M129 Arthropathy, unspecified: Secondary | ICD-10-CM | POA: Diagnosis not present

## 2020-01-09 DIAGNOSIS — G8929 Other chronic pain: Secondary | ICD-10-CM | POA: Diagnosis not present

## 2020-01-09 DIAGNOSIS — Z720 Tobacco use: Secondary | ICD-10-CM | POA: Diagnosis not present

## 2020-01-09 DIAGNOSIS — M25512 Pain in left shoulder: Secondary | ICD-10-CM | POA: Diagnosis not present

## 2020-02-20 DIAGNOSIS — G8929 Other chronic pain: Secondary | ICD-10-CM | POA: Diagnosis not present

## 2020-02-20 DIAGNOSIS — M25512 Pain in left shoulder: Secondary | ICD-10-CM | POA: Diagnosis not present

## 2020-02-20 DIAGNOSIS — Z0000 Encounter for general adult medical examination without abnormal findings: Secondary | ICD-10-CM | POA: Diagnosis not present

## 2020-04-27 DIAGNOSIS — Z79899 Other long term (current) drug therapy: Secondary | ICD-10-CM | POA: Diagnosis not present

## 2020-04-27 DIAGNOSIS — N390 Urinary tract infection, site not specified: Secondary | ICD-10-CM | POA: Diagnosis not present

## 2020-04-27 DIAGNOSIS — N2889 Other specified disorders of kidney and ureter: Secondary | ICD-10-CM | POA: Diagnosis not present

## 2020-04-27 DIAGNOSIS — R319 Hematuria, unspecified: Secondary | ICD-10-CM | POA: Diagnosis not present

## 2020-04-27 DIAGNOSIS — R590 Localized enlarged lymph nodes: Secondary | ICD-10-CM | POA: Diagnosis not present

## 2020-04-27 DIAGNOSIS — D7389 Other diseases of spleen: Secondary | ICD-10-CM | POA: Diagnosis not present

## 2020-04-27 DIAGNOSIS — N289 Disorder of kidney and ureter, unspecified: Secondary | ICD-10-CM | POA: Diagnosis not present

## 2020-04-27 DIAGNOSIS — I739 Peripheral vascular disease, unspecified: Secondary | ICD-10-CM | POA: Diagnosis not present

## 2020-04-27 DIAGNOSIS — K449 Diaphragmatic hernia without obstruction or gangrene: Secondary | ICD-10-CM | POA: Diagnosis not present

## 2020-04-27 DIAGNOSIS — N1330 Unspecified hydronephrosis: Secondary | ICD-10-CM | POA: Diagnosis not present

## 2020-04-27 DIAGNOSIS — R1031 Right lower quadrant pain: Secondary | ICD-10-CM | POA: Diagnosis not present

## 2020-05-02 DIAGNOSIS — N3001 Acute cystitis with hematuria: Secondary | ICD-10-CM | POA: Diagnosis not present

## 2020-05-02 DIAGNOSIS — R935 Abnormal findings on diagnostic imaging of other abdominal regions, including retroperitoneum: Secondary | ICD-10-CM | POA: Diagnosis not present

## 2020-05-22 DIAGNOSIS — Z720 Tobacco use: Secondary | ICD-10-CM | POA: Diagnosis not present

## 2020-05-22 DIAGNOSIS — R3129 Other microscopic hematuria: Secondary | ICD-10-CM | POA: Diagnosis not present

## 2020-05-22 DIAGNOSIS — R1031 Right lower quadrant pain: Secondary | ICD-10-CM | POA: Diagnosis not present

## 2020-05-22 DIAGNOSIS — R93429 Abnormal radiologic findings on diagnostic imaging of unspecified kidney: Secondary | ICD-10-CM | POA: Diagnosis not present

## 2020-06-04 DIAGNOSIS — R93429 Abnormal radiologic findings on diagnostic imaging of unspecified kidney: Secondary | ICD-10-CM | POA: Diagnosis not present

## 2020-06-14 DIAGNOSIS — Z01812 Encounter for preprocedural laboratory examination: Secondary | ICD-10-CM | POA: Diagnosis not present

## 2020-06-20 DIAGNOSIS — K219 Gastro-esophageal reflux disease without esophagitis: Secondary | ICD-10-CM | POA: Diagnosis not present

## 2020-06-20 DIAGNOSIS — M1990 Unspecified osteoarthritis, unspecified site: Secondary | ICD-10-CM | POA: Diagnosis not present

## 2020-06-20 DIAGNOSIS — C651 Malignant neoplasm of right renal pelvis: Secondary | ICD-10-CM | POA: Diagnosis not present

## 2020-06-20 DIAGNOSIS — F17200 Nicotine dependence, unspecified, uncomplicated: Secondary | ICD-10-CM | POA: Diagnosis not present

## 2020-06-20 DIAGNOSIS — C689 Malignant neoplasm of urinary organ, unspecified: Secondary | ICD-10-CM | POA: Diagnosis not present

## 2020-06-20 DIAGNOSIS — I700 Atherosclerosis of aorta: Secondary | ICD-10-CM | POA: Diagnosis not present

## 2020-06-20 DIAGNOSIS — N2889 Other specified disorders of kidney and ureter: Secondary | ICD-10-CM | POA: Diagnosis not present

## 2020-06-20 DIAGNOSIS — N132 Hydronephrosis with renal and ureteral calculous obstruction: Secondary | ICD-10-CM | POA: Diagnosis not present

## 2020-06-20 DIAGNOSIS — F17210 Nicotine dependence, cigarettes, uncomplicated: Secondary | ICD-10-CM | POA: Diagnosis not present

## 2020-06-20 DIAGNOSIS — M5136 Other intervertebral disc degeneration, lumbar region: Secondary | ICD-10-CM | POA: Diagnosis not present

## 2020-06-20 DIAGNOSIS — Z79899 Other long term (current) drug therapy: Secondary | ICD-10-CM | POA: Diagnosis not present

## 2020-06-29 DIAGNOSIS — R93429 Abnormal radiologic findings on diagnostic imaging of unspecified kidney: Secondary | ICD-10-CM | POA: Diagnosis not present

## 2020-06-29 DIAGNOSIS — N2889 Other specified disorders of kidney and ureter: Secondary | ICD-10-CM | POA: Diagnosis not present

## 2020-06-29 DIAGNOSIS — Z720 Tobacco use: Secondary | ICD-10-CM | POA: Diagnosis not present

## 2020-07-04 DIAGNOSIS — F17210 Nicotine dependence, cigarettes, uncomplicated: Secondary | ICD-10-CM | POA: Diagnosis not present

## 2020-07-04 DIAGNOSIS — D649 Anemia, unspecified: Secondary | ICD-10-CM | POA: Diagnosis not present

## 2020-07-04 DIAGNOSIS — R93429 Abnormal radiologic findings on diagnostic imaging of unspecified kidney: Secondary | ICD-10-CM | POA: Diagnosis not present

## 2020-07-04 DIAGNOSIS — M1990 Unspecified osteoarthritis, unspecified site: Secondary | ICD-10-CM | POA: Diagnosis not present

## 2020-07-04 DIAGNOSIS — C651 Malignant neoplasm of right renal pelvis: Secondary | ICD-10-CM | POA: Diagnosis not present

## 2020-07-04 DIAGNOSIS — Z5111 Encounter for antineoplastic chemotherapy: Secondary | ICD-10-CM | POA: Diagnosis not present

## 2020-07-09 DIAGNOSIS — M1990 Unspecified osteoarthritis, unspecified site: Secondary | ICD-10-CM | POA: Diagnosis not present

## 2020-07-09 DIAGNOSIS — D649 Anemia, unspecified: Secondary | ICD-10-CM | POA: Diagnosis not present

## 2020-07-09 DIAGNOSIS — R93429 Abnormal radiologic findings on diagnostic imaging of unspecified kidney: Secondary | ICD-10-CM | POA: Diagnosis not present

## 2020-07-09 DIAGNOSIS — F17210 Nicotine dependence, cigarettes, uncomplicated: Secondary | ICD-10-CM | POA: Diagnosis not present

## 2020-07-09 DIAGNOSIS — C651 Malignant neoplasm of right renal pelvis: Secondary | ICD-10-CM | POA: Diagnosis not present

## 2020-07-09 DIAGNOSIS — Z5111 Encounter for antineoplastic chemotherapy: Secondary | ICD-10-CM | POA: Diagnosis not present

## 2020-07-11 DIAGNOSIS — M129 Arthropathy, unspecified: Secondary | ICD-10-CM | POA: Diagnosis not present

## 2020-07-11 DIAGNOSIS — C651 Malignant neoplasm of right renal pelvis: Secondary | ICD-10-CM | POA: Diagnosis not present

## 2020-07-16 DIAGNOSIS — M1990 Unspecified osteoarthritis, unspecified site: Secondary | ICD-10-CM | POA: Diagnosis not present

## 2020-07-16 DIAGNOSIS — D649 Anemia, unspecified: Secondary | ICD-10-CM | POA: Diagnosis not present

## 2020-07-16 DIAGNOSIS — R93429 Abnormal radiologic findings on diagnostic imaging of unspecified kidney: Secondary | ICD-10-CM | POA: Diagnosis not present

## 2020-07-16 DIAGNOSIS — F17210 Nicotine dependence, cigarettes, uncomplicated: Secondary | ICD-10-CM | POA: Diagnosis not present

## 2020-07-16 DIAGNOSIS — Z5111 Encounter for antineoplastic chemotherapy: Secondary | ICD-10-CM | POA: Diagnosis not present

## 2020-07-16 DIAGNOSIS — C651 Malignant neoplasm of right renal pelvis: Secondary | ICD-10-CM | POA: Diagnosis not present

## 2020-07-17 DIAGNOSIS — C651 Malignant neoplasm of right renal pelvis: Secondary | ICD-10-CM | POA: Diagnosis not present

## 2020-07-30 DIAGNOSIS — T451X5A Adverse effect of antineoplastic and immunosuppressive drugs, initial encounter: Secondary | ICD-10-CM | POA: Diagnosis not present

## 2020-07-30 DIAGNOSIS — Z5111 Encounter for antineoplastic chemotherapy: Secondary | ICD-10-CM | POA: Diagnosis not present

## 2020-07-30 DIAGNOSIS — Z5181 Encounter for therapeutic drug level monitoring: Secondary | ICD-10-CM | POA: Diagnosis not present

## 2020-07-30 DIAGNOSIS — Z87891 Personal history of nicotine dependence: Secondary | ICD-10-CM | POA: Diagnosis not present

## 2020-07-30 DIAGNOSIS — Z79899 Other long term (current) drug therapy: Secondary | ICD-10-CM | POA: Diagnosis not present

## 2020-07-30 DIAGNOSIS — M129 Arthropathy, unspecified: Secondary | ICD-10-CM | POA: Diagnosis not present

## 2020-07-30 DIAGNOSIS — R944 Abnormal results of kidney function studies: Secondary | ICD-10-CM | POA: Diagnosis not present

## 2020-07-30 DIAGNOSIS — C651 Malignant neoplasm of right renal pelvis: Secondary | ICD-10-CM | POA: Diagnosis not present

## 2020-07-30 DIAGNOSIS — D649 Anemia, unspecified: Secondary | ICD-10-CM | POA: Diagnosis not present

## 2020-07-30 DIAGNOSIS — R110 Nausea: Secondary | ICD-10-CM | POA: Diagnosis not present

## 2020-08-06 DIAGNOSIS — Z87891 Personal history of nicotine dependence: Secondary | ICD-10-CM | POA: Diagnosis not present

## 2020-08-06 DIAGNOSIS — Z5111 Encounter for antineoplastic chemotherapy: Secondary | ICD-10-CM | POA: Diagnosis not present

## 2020-08-06 DIAGNOSIS — Z79899 Other long term (current) drug therapy: Secondary | ICD-10-CM | POA: Diagnosis not present

## 2020-08-06 DIAGNOSIS — T451X5A Adverse effect of antineoplastic and immunosuppressive drugs, initial encounter: Secondary | ICD-10-CM | POA: Diagnosis not present

## 2020-08-06 DIAGNOSIS — Z5181 Encounter for therapeutic drug level monitoring: Secondary | ICD-10-CM | POA: Diagnosis not present

## 2020-08-06 DIAGNOSIS — R944 Abnormal results of kidney function studies: Secondary | ICD-10-CM | POA: Diagnosis not present

## 2020-08-06 DIAGNOSIS — D649 Anemia, unspecified: Secondary | ICD-10-CM | POA: Diagnosis not present

## 2020-08-06 DIAGNOSIS — C651 Malignant neoplasm of right renal pelvis: Secondary | ICD-10-CM | POA: Diagnosis not present

## 2020-08-06 DIAGNOSIS — R110 Nausea: Secondary | ICD-10-CM | POA: Diagnosis not present

## 2020-08-07 DIAGNOSIS — D649 Anemia, unspecified: Secondary | ICD-10-CM | POA: Diagnosis not present

## 2020-08-07 DIAGNOSIS — Z87891 Personal history of nicotine dependence: Secondary | ICD-10-CM | POA: Diagnosis not present

## 2020-08-07 DIAGNOSIS — Z5181 Encounter for therapeutic drug level monitoring: Secondary | ICD-10-CM | POA: Diagnosis not present

## 2020-08-07 DIAGNOSIS — Z5111 Encounter for antineoplastic chemotherapy: Secondary | ICD-10-CM | POA: Diagnosis not present

## 2020-08-07 DIAGNOSIS — Z79899 Other long term (current) drug therapy: Secondary | ICD-10-CM | POA: Diagnosis not present

## 2020-08-07 DIAGNOSIS — C651 Malignant neoplasm of right renal pelvis: Secondary | ICD-10-CM | POA: Diagnosis not present

## 2020-08-07 DIAGNOSIS — R110 Nausea: Secondary | ICD-10-CM | POA: Diagnosis not present

## 2020-08-07 DIAGNOSIS — R944 Abnormal results of kidney function studies: Secondary | ICD-10-CM | POA: Diagnosis not present

## 2020-08-07 DIAGNOSIS — T451X5A Adverse effect of antineoplastic and immunosuppressive drugs, initial encounter: Secondary | ICD-10-CM | POA: Diagnosis not present

## 2020-08-20 DIAGNOSIS — Z5111 Encounter for antineoplastic chemotherapy: Secondary | ICD-10-CM | POA: Diagnosis not present

## 2020-08-20 DIAGNOSIS — C651 Malignant neoplasm of right renal pelvis: Secondary | ICD-10-CM | POA: Diagnosis not present

## 2020-08-27 DIAGNOSIS — Z5111 Encounter for antineoplastic chemotherapy: Secondary | ICD-10-CM | POA: Diagnosis not present

## 2020-08-27 DIAGNOSIS — R079 Chest pain, unspecified: Secondary | ICD-10-CM | POA: Diagnosis not present

## 2020-08-27 DIAGNOSIS — Z5112 Encounter for antineoplastic immunotherapy: Secondary | ICD-10-CM | POA: Diagnosis not present

## 2020-08-27 DIAGNOSIS — R109 Unspecified abdominal pain: Secondary | ICD-10-CM | POA: Diagnosis not present

## 2020-08-27 DIAGNOSIS — C651 Malignant neoplasm of right renal pelvis: Secondary | ICD-10-CM | POA: Diagnosis not present

## 2020-08-27 DIAGNOSIS — T451X5A Adverse effect of antineoplastic and immunosuppressive drugs, initial encounter: Secondary | ICD-10-CM | POA: Diagnosis not present

## 2020-08-27 DIAGNOSIS — F17200 Nicotine dependence, unspecified, uncomplicated: Secondary | ICD-10-CM | POA: Diagnosis not present

## 2020-08-27 DIAGNOSIS — Z5181 Encounter for therapeutic drug level monitoring: Secondary | ICD-10-CM | POA: Diagnosis not present

## 2020-08-27 DIAGNOSIS — D6481 Anemia due to antineoplastic chemotherapy: Secondary | ICD-10-CM | POA: Diagnosis not present

## 2020-08-27 DIAGNOSIS — Z79899 Other long term (current) drug therapy: Secondary | ICD-10-CM | POA: Diagnosis not present

## 2020-08-27 DIAGNOSIS — R110 Nausea: Secondary | ICD-10-CM | POA: Diagnosis not present

## 2020-08-27 DIAGNOSIS — R7989 Other specified abnormal findings of blood chemistry: Secondary | ICD-10-CM | POA: Diagnosis not present

## 2020-08-28 DIAGNOSIS — Z5181 Encounter for therapeutic drug level monitoring: Secondary | ICD-10-CM | POA: Diagnosis not present

## 2020-08-28 DIAGNOSIS — T451X5A Adverse effect of antineoplastic and immunosuppressive drugs, initial encounter: Secondary | ICD-10-CM | POA: Diagnosis not present

## 2020-08-28 DIAGNOSIS — Z5112 Encounter for antineoplastic immunotherapy: Secondary | ICD-10-CM | POA: Diagnosis not present

## 2020-08-28 DIAGNOSIS — R7989 Other specified abnormal findings of blood chemistry: Secondary | ICD-10-CM | POA: Diagnosis not present

## 2020-08-28 DIAGNOSIS — Z79899 Other long term (current) drug therapy: Secondary | ICD-10-CM | POA: Diagnosis not present

## 2020-08-28 DIAGNOSIS — R110 Nausea: Secondary | ICD-10-CM | POA: Diagnosis not present

## 2020-08-28 DIAGNOSIS — R109 Unspecified abdominal pain: Secondary | ICD-10-CM | POA: Diagnosis not present

## 2020-08-28 DIAGNOSIS — R079 Chest pain, unspecified: Secondary | ICD-10-CM | POA: Diagnosis not present

## 2020-08-28 DIAGNOSIS — F17200 Nicotine dependence, unspecified, uncomplicated: Secondary | ICD-10-CM | POA: Diagnosis not present

## 2020-08-28 DIAGNOSIS — D6481 Anemia due to antineoplastic chemotherapy: Secondary | ICD-10-CM | POA: Diagnosis not present

## 2020-08-28 DIAGNOSIS — Z5111 Encounter for antineoplastic chemotherapy: Secondary | ICD-10-CM | POA: Diagnosis not present

## 2020-08-28 DIAGNOSIS — C651 Malignant neoplasm of right renal pelvis: Secondary | ICD-10-CM | POA: Diagnosis not present

## 2020-08-30 DIAGNOSIS — U071 COVID-19: Secondary | ICD-10-CM | POA: Diagnosis not present

## 2020-08-30 DIAGNOSIS — R918 Other nonspecific abnormal finding of lung field: Secondary | ICD-10-CM | POA: Diagnosis not present

## 2020-08-30 DIAGNOSIS — Z833 Family history of diabetes mellitus: Secondary | ICD-10-CM | POA: Diagnosis not present

## 2020-08-30 DIAGNOSIS — Z79899 Other long term (current) drug therapy: Secondary | ICD-10-CM | POA: Diagnosis not present

## 2020-08-30 DIAGNOSIS — J9601 Acute respiratory failure with hypoxia: Secondary | ICD-10-CM | POA: Diagnosis not present

## 2020-08-30 DIAGNOSIS — K219 Gastro-esophageal reflux disease without esophagitis: Secondary | ICD-10-CM | POA: Diagnosis not present

## 2020-08-30 DIAGNOSIS — F17210 Nicotine dependence, cigarettes, uncomplicated: Secondary | ICD-10-CM | POA: Diagnosis not present

## 2020-08-30 DIAGNOSIS — R059 Cough, unspecified: Secondary | ICD-10-CM | POA: Diagnosis not present

## 2020-08-30 DIAGNOSIS — E871 Hypo-osmolality and hyponatremia: Secondary | ICD-10-CM | POA: Diagnosis not present

## 2020-08-30 DIAGNOSIS — Z808 Family history of malignant neoplasm of other organs or systems: Secondary | ICD-10-CM | POA: Diagnosis not present

## 2020-08-30 DIAGNOSIS — R195 Other fecal abnormalities: Secondary | ICD-10-CM | POA: Diagnosis not present

## 2020-08-30 DIAGNOSIS — D649 Anemia, unspecified: Secondary | ICD-10-CM | POA: Diagnosis not present

## 2020-08-30 DIAGNOSIS — E876 Hypokalemia: Secondary | ICD-10-CM | POA: Diagnosis not present

## 2020-08-30 DIAGNOSIS — M159 Polyosteoarthritis, unspecified: Secondary | ICD-10-CM | POA: Diagnosis not present

## 2020-08-30 DIAGNOSIS — D630 Anemia in neoplastic disease: Secondary | ICD-10-CM | POA: Diagnosis not present

## 2020-08-30 DIAGNOSIS — R9431 Abnormal electrocardiogram [ECG] [EKG]: Secondary | ICD-10-CM | POA: Diagnosis not present

## 2020-08-30 DIAGNOSIS — Z20822 Contact with and (suspected) exposure to covid-19: Secondary | ICD-10-CM | POA: Diagnosis not present

## 2020-08-30 DIAGNOSIS — D696 Thrombocytopenia, unspecified: Secondary | ICD-10-CM | POA: Diagnosis not present

## 2020-08-30 DIAGNOSIS — C651 Malignant neoplasm of right renal pelvis: Secondary | ICD-10-CM | POA: Diagnosis not present

## 2020-08-31 DIAGNOSIS — R9431 Abnormal electrocardiogram [ECG] [EKG]: Secondary | ICD-10-CM | POA: Diagnosis not present

## 2020-08-31 DIAGNOSIS — D649 Anemia, unspecified: Secondary | ICD-10-CM | POA: Diagnosis not present

## 2020-10-03 DIAGNOSIS — C651 Malignant neoplasm of right renal pelvis: Secondary | ICD-10-CM | POA: Diagnosis not present

## 2020-10-03 DIAGNOSIS — D630 Anemia in neoplastic disease: Secondary | ICD-10-CM | POA: Diagnosis not present

## 2020-10-03 DIAGNOSIS — D649 Anemia, unspecified: Secondary | ICD-10-CM | POA: Diagnosis not present

## 2020-10-09 DIAGNOSIS — K449 Diaphragmatic hernia without obstruction or gangrene: Secondary | ICD-10-CM | POA: Diagnosis not present

## 2020-10-09 DIAGNOSIS — C651 Malignant neoplasm of right renal pelvis: Secondary | ICD-10-CM | POA: Diagnosis not present

## 2020-10-09 DIAGNOSIS — J90 Pleural effusion, not elsewhere classified: Secondary | ICD-10-CM | POA: Diagnosis not present

## 2020-10-09 DIAGNOSIS — J432 Centrilobular emphysema: Secondary | ICD-10-CM | POA: Diagnosis not present

## 2020-10-09 DIAGNOSIS — C679 Malignant neoplasm of bladder, unspecified: Secondary | ICD-10-CM | POA: Diagnosis not present

## 2020-10-10 DIAGNOSIS — C651 Malignant neoplasm of right renal pelvis: Secondary | ICD-10-CM | POA: Diagnosis not present

## 2020-10-10 DIAGNOSIS — D630 Anemia in neoplastic disease: Secondary | ICD-10-CM | POA: Diagnosis not present

## 2020-10-10 DIAGNOSIS — D649 Anemia, unspecified: Secondary | ICD-10-CM | POA: Diagnosis not present

## 2020-11-02 DIAGNOSIS — Z720 Tobacco use: Secondary | ICD-10-CM | POA: Diagnosis not present

## 2020-11-02 DIAGNOSIS — N2889 Other specified disorders of kidney and ureter: Secondary | ICD-10-CM | POA: Diagnosis not present

## 2020-11-02 DIAGNOSIS — R3129 Other microscopic hematuria: Secondary | ICD-10-CM | POA: Diagnosis not present

## 2020-11-14 DIAGNOSIS — K219 Gastro-esophageal reflux disease without esophagitis: Secondary | ICD-10-CM | POA: Diagnosis not present

## 2020-11-14 DIAGNOSIS — C651 Malignant neoplasm of right renal pelvis: Secondary | ICD-10-CM | POA: Diagnosis not present

## 2020-12-31 DIAGNOSIS — C651 Malignant neoplasm of right renal pelvis: Secondary | ICD-10-CM | POA: Diagnosis not present

## 2020-12-31 DIAGNOSIS — D649 Anemia, unspecified: Secondary | ICD-10-CM | POA: Diagnosis not present

## 2020-12-31 DIAGNOSIS — M129 Arthropathy, unspecified: Secondary | ICD-10-CM | POA: Diagnosis not present

## 2021-01-04 DIAGNOSIS — N289 Disorder of kidney and ureter, unspecified: Secondary | ICD-10-CM | POA: Diagnosis not present

## 2021-01-04 DIAGNOSIS — K219 Gastro-esophageal reflux disease without esophagitis: Secondary | ICD-10-CM | POA: Diagnosis not present

## 2021-01-04 DIAGNOSIS — C649 Malignant neoplasm of unspecified kidney, except renal pelvis: Secondary | ICD-10-CM | POA: Diagnosis not present

## 2021-01-04 DIAGNOSIS — N390 Urinary tract infection, site not specified: Secondary | ICD-10-CM | POA: Diagnosis not present

## 2021-01-04 DIAGNOSIS — R918 Other nonspecific abnormal finding of lung field: Secondary | ICD-10-CM | POA: Diagnosis not present

## 2021-01-04 DIAGNOSIS — F17210 Nicotine dependence, cigarettes, uncomplicated: Secondary | ICD-10-CM | POA: Diagnosis not present

## 2021-01-04 DIAGNOSIS — R109 Unspecified abdominal pain: Secondary | ICD-10-CM | POA: Diagnosis not present

## 2021-01-04 DIAGNOSIS — R319 Hematuria, unspecified: Secondary | ICD-10-CM | POA: Diagnosis not present

## 2021-01-04 DIAGNOSIS — Z79899 Other long term (current) drug therapy: Secondary | ICD-10-CM | POA: Diagnosis not present

## 2021-01-04 DIAGNOSIS — C641 Malignant neoplasm of right kidney, except renal pelvis: Secondary | ICD-10-CM | POA: Diagnosis not present

## 2021-01-04 DIAGNOSIS — R10819 Abdominal tenderness, unspecified site: Secondary | ICD-10-CM | POA: Diagnosis not present

## 2021-01-04 DIAGNOSIS — K449 Diaphragmatic hernia without obstruction or gangrene: Secondary | ICD-10-CM | POA: Diagnosis not present

## 2021-01-04 DIAGNOSIS — N1330 Unspecified hydronephrosis: Secondary | ICD-10-CM | POA: Diagnosis not present

## 2021-01-30 DIAGNOSIS — C651 Malignant neoplasm of right renal pelvis: Secondary | ICD-10-CM | POA: Diagnosis not present

## 2021-01-30 DIAGNOSIS — D649 Anemia, unspecified: Secondary | ICD-10-CM | POA: Diagnosis not present

## 2021-02-05 DIAGNOSIS — Z720 Tobacco use: Secondary | ICD-10-CM | POA: Diagnosis not present

## 2021-02-05 DIAGNOSIS — C651 Malignant neoplasm of right renal pelvis: Secondary | ICD-10-CM | POA: Diagnosis not present

## 2021-03-12 DIAGNOSIS — R1084 Generalized abdominal pain: Secondary | ICD-10-CM | POA: Diagnosis not present

## 2021-03-12 DIAGNOSIS — Z85528 Personal history of other malignant neoplasm of kidney: Secondary | ICD-10-CM | POA: Diagnosis not present

## 2021-03-12 DIAGNOSIS — R319 Hematuria, unspecified: Secondary | ICD-10-CM | POA: Diagnosis not present

## 2021-03-14 DIAGNOSIS — D649 Anemia, unspecified: Secondary | ICD-10-CM | POA: Diagnosis not present

## 2021-03-14 DIAGNOSIS — Z79899 Other long term (current) drug therapy: Secondary | ICD-10-CM | POA: Diagnosis not present

## 2021-03-14 DIAGNOSIS — Z01812 Encounter for preprocedural laboratory examination: Secondary | ICD-10-CM | POA: Diagnosis not present

## 2021-03-14 DIAGNOSIS — C651 Malignant neoplasm of right renal pelvis: Secondary | ICD-10-CM | POA: Diagnosis not present

## 2021-03-14 DIAGNOSIS — Z01810 Encounter for preprocedural cardiovascular examination: Secondary | ICD-10-CM | POA: Diagnosis not present

## 2021-03-14 DIAGNOSIS — K219 Gastro-esophageal reflux disease without esophagitis: Secondary | ICD-10-CM | POA: Diagnosis not present

## 2021-03-14 DIAGNOSIS — Z01818 Encounter for other preprocedural examination: Secondary | ICD-10-CM | POA: Diagnosis not present

## 2021-03-14 DIAGNOSIS — Z720 Tobacco use: Secondary | ICD-10-CM | POA: Diagnosis not present

## 2021-03-28 DIAGNOSIS — J90 Pleural effusion, not elsewhere classified: Secondary | ICD-10-CM | POA: Diagnosis not present

## 2021-03-28 DIAGNOSIS — R918 Other nonspecific abnormal finding of lung field: Secondary | ICD-10-CM | POA: Diagnosis not present

## 2021-03-28 DIAGNOSIS — M1990 Unspecified osteoarthritis, unspecified site: Secondary | ICD-10-CM | POA: Diagnosis not present

## 2021-03-28 DIAGNOSIS — J984 Other disorders of lung: Secondary | ICD-10-CM | POA: Diagnosis not present

## 2021-03-28 DIAGNOSIS — C688 Malignant neoplasm of overlapping sites of urinary organs: Secondary | ICD-10-CM | POA: Diagnosis not present

## 2021-03-28 DIAGNOSIS — R000 Tachycardia, unspecified: Secondary | ICD-10-CM | POA: Diagnosis not present

## 2021-03-28 DIAGNOSIS — Z720 Tobacco use: Secondary | ICD-10-CM | POA: Diagnosis not present

## 2021-03-28 DIAGNOSIS — D62 Acute posthemorrhagic anemia: Secondary | ICD-10-CM | POA: Diagnosis not present

## 2021-03-28 DIAGNOSIS — D649 Anemia, unspecified: Secondary | ICD-10-CM | POA: Diagnosis not present

## 2021-03-28 DIAGNOSIS — R531 Weakness: Secondary | ICD-10-CM | POA: Diagnosis not present

## 2021-03-28 DIAGNOSIS — J439 Emphysema, unspecified: Secondary | ICD-10-CM | POA: Diagnosis not present

## 2021-03-28 DIAGNOSIS — C689 Malignant neoplasm of urinary organ, unspecified: Secondary | ICD-10-CM | POA: Diagnosis not present

## 2021-03-28 DIAGNOSIS — N179 Acute kidney failure, unspecified: Secondary | ICD-10-CM | POA: Diagnosis not present

## 2021-03-28 DIAGNOSIS — K219 Gastro-esophageal reflux disease without esophagitis: Secondary | ICD-10-CM | POA: Diagnosis not present

## 2021-03-28 DIAGNOSIS — Z79899 Other long term (current) drug therapy: Secondary | ICD-10-CM | POA: Diagnosis not present

## 2021-03-28 DIAGNOSIS — N1832 Chronic kidney disease, stage 3b: Secondary | ICD-10-CM | POA: Diagnosis not present

## 2021-03-28 DIAGNOSIS — J449 Chronic obstructive pulmonary disease, unspecified: Secondary | ICD-10-CM | POA: Diagnosis not present

## 2021-03-28 DIAGNOSIS — C651 Malignant neoplasm of right renal pelvis: Secondary | ICD-10-CM | POA: Diagnosis not present

## 2021-03-28 DIAGNOSIS — F17200 Nicotine dependence, unspecified, uncomplicated: Secondary | ICD-10-CM | POA: Diagnosis not present

## 2021-03-28 DIAGNOSIS — Z20822 Contact with and (suspected) exposure to covid-19: Secondary | ICD-10-CM | POA: Diagnosis not present

## 2021-03-28 DIAGNOSIS — R0902 Hypoxemia: Secondary | ICD-10-CM | POA: Diagnosis not present

## 2021-03-28 DIAGNOSIS — Z8616 Personal history of COVID-19: Secondary | ICD-10-CM | POA: Diagnosis not present

## 2021-03-28 DIAGNOSIS — K449 Diaphragmatic hernia without obstruction or gangrene: Secondary | ICD-10-CM | POA: Diagnosis not present

## 2021-04-02 DIAGNOSIS — J449 Chronic obstructive pulmonary disease, unspecified: Secondary | ICD-10-CM | POA: Diagnosis not present

## 2021-04-09 DIAGNOSIS — C651 Malignant neoplasm of right renal pelvis: Secondary | ICD-10-CM | POA: Diagnosis not present

## 2021-04-09 DIAGNOSIS — N3289 Other specified disorders of bladder: Secondary | ICD-10-CM | POA: Diagnosis not present

## 2021-04-10 DIAGNOSIS — C651 Malignant neoplasm of right renal pelvis: Secondary | ICD-10-CM | POA: Diagnosis not present

## 2021-04-10 DIAGNOSIS — D649 Anemia, unspecified: Secondary | ICD-10-CM | POA: Diagnosis not present

## 2021-04-30 DIAGNOSIS — D649 Anemia, unspecified: Secondary | ICD-10-CM | POA: Diagnosis not present

## 2021-04-30 DIAGNOSIS — E538 Deficiency of other specified B group vitamins: Secondary | ICD-10-CM | POA: Diagnosis not present

## 2021-04-30 DIAGNOSIS — C651 Malignant neoplasm of right renal pelvis: Secondary | ICD-10-CM | POA: Diagnosis not present

## 2021-05-03 DIAGNOSIS — J449 Chronic obstructive pulmonary disease, unspecified: Secondary | ICD-10-CM | POA: Diagnosis not present

## 2021-05-14 DIAGNOSIS — Z0000 Encounter for general adult medical examination without abnormal findings: Secondary | ICD-10-CM | POA: Diagnosis not present

## 2021-06-02 DIAGNOSIS — J449 Chronic obstructive pulmonary disease, unspecified: Secondary | ICD-10-CM | POA: Diagnosis not present

## 2021-06-28 DIAGNOSIS — J449 Chronic obstructive pulmonary disease, unspecified: Secondary | ICD-10-CM | POA: Diagnosis not present

## 2021-06-28 DIAGNOSIS — K5790 Diverticulosis of intestine, part unspecified, without perforation or abscess without bleeding: Secondary | ICD-10-CM | POA: Diagnosis not present

## 2021-06-28 DIAGNOSIS — D7389 Other diseases of spleen: Secondary | ICD-10-CM | POA: Diagnosis not present

## 2021-06-28 DIAGNOSIS — N3289 Other specified disorders of bladder: Secondary | ICD-10-CM | POA: Diagnosis not present

## 2021-06-28 DIAGNOSIS — J984 Other disorders of lung: Secondary | ICD-10-CM | POA: Diagnosis not present

## 2021-06-28 DIAGNOSIS — R918 Other nonspecific abnormal finding of lung field: Secondary | ICD-10-CM | POA: Diagnosis not present

## 2021-06-28 DIAGNOSIS — C651 Malignant neoplasm of right renal pelvis: Secondary | ICD-10-CM | POA: Diagnosis not present

## 2021-06-28 DIAGNOSIS — D649 Anemia, unspecified: Secondary | ICD-10-CM | POA: Diagnosis not present

## 2021-07-02 DIAGNOSIS — D649 Anemia, unspecified: Secondary | ICD-10-CM | POA: Diagnosis not present

## 2021-07-02 DIAGNOSIS — C651 Malignant neoplasm of right renal pelvis: Secondary | ICD-10-CM | POA: Diagnosis not present

## 2021-07-03 DIAGNOSIS — J449 Chronic obstructive pulmonary disease, unspecified: Secondary | ICD-10-CM | POA: Diagnosis not present

## 2021-08-02 DIAGNOSIS — J449 Chronic obstructive pulmonary disease, unspecified: Secondary | ICD-10-CM | POA: Diagnosis not present

## 2021-09-02 DIAGNOSIS — J449 Chronic obstructive pulmonary disease, unspecified: Secondary | ICD-10-CM | POA: Diagnosis not present

## 2021-10-02 DIAGNOSIS — D649 Anemia, unspecified: Secondary | ICD-10-CM | POA: Diagnosis not present

## 2021-10-02 DIAGNOSIS — Z905 Acquired absence of kidney: Secondary | ICD-10-CM | POA: Diagnosis not present

## 2021-10-02 DIAGNOSIS — D7389 Other diseases of spleen: Secondary | ICD-10-CM | POA: Diagnosis not present

## 2021-10-02 DIAGNOSIS — C679 Malignant neoplasm of bladder, unspecified: Secondary | ICD-10-CM | POA: Diagnosis not present

## 2021-10-02 DIAGNOSIS — C651 Malignant neoplasm of right renal pelvis: Secondary | ICD-10-CM | POA: Diagnosis not present

## 2021-10-02 DIAGNOSIS — K449 Diaphragmatic hernia without obstruction or gangrene: Secondary | ICD-10-CM | POA: Diagnosis not present

## 2021-10-03 DIAGNOSIS — D649 Anemia, unspecified: Secondary | ICD-10-CM | POA: Diagnosis not present

## 2021-10-03 DIAGNOSIS — M899 Disorder of bone, unspecified: Secondary | ICD-10-CM | POA: Diagnosis not present

## 2021-10-03 DIAGNOSIS — J449 Chronic obstructive pulmonary disease, unspecified: Secondary | ICD-10-CM | POA: Diagnosis not present

## 2021-10-03 DIAGNOSIS — C651 Malignant neoplasm of right renal pelvis: Secondary | ICD-10-CM | POA: Diagnosis not present

## 2021-10-03 DIAGNOSIS — M129 Arthropathy, unspecified: Secondary | ICD-10-CM | POA: Diagnosis not present

## 2021-10-21 DIAGNOSIS — C651 Malignant neoplasm of right renal pelvis: Secondary | ICD-10-CM | POA: Diagnosis not present

## 2021-10-21 DIAGNOSIS — C787 Secondary malignant neoplasm of liver and intrahepatic bile duct: Secondary | ICD-10-CM | POA: Diagnosis not present

## 2021-10-21 DIAGNOSIS — M899 Disorder of bone, unspecified: Secondary | ICD-10-CM | POA: Diagnosis not present

## 2021-10-21 DIAGNOSIS — C7951 Secondary malignant neoplasm of bone: Secondary | ICD-10-CM | POA: Diagnosis not present

## 2021-10-21 DIAGNOSIS — M129 Arthropathy, unspecified: Secondary | ICD-10-CM | POA: Diagnosis not present

## 2021-10-22 DIAGNOSIS — R935 Abnormal findings on diagnostic imaging of other abdominal regions, including retroperitoneum: Secondary | ICD-10-CM | POA: Diagnosis not present

## 2021-10-22 DIAGNOSIS — C651 Malignant neoplasm of right renal pelvis: Secondary | ICD-10-CM | POA: Diagnosis not present

## 2021-10-22 DIAGNOSIS — C7951 Secondary malignant neoplasm of bone: Secondary | ICD-10-CM | POA: Diagnosis not present

## 2021-10-22 DIAGNOSIS — R1900 Intra-abdominal and pelvic swelling, mass and lump, unspecified site: Secondary | ICD-10-CM | POA: Diagnosis not present

## 2021-10-31 DIAGNOSIS — J449 Chronic obstructive pulmonary disease, unspecified: Secondary | ICD-10-CM | POA: Diagnosis not present

## 2021-10-31 DIAGNOSIS — M545 Low back pain, unspecified: Secondary | ICD-10-CM | POA: Diagnosis not present

## 2021-10-31 DIAGNOSIS — Z0000 Encounter for general adult medical examination without abnormal findings: Secondary | ICD-10-CM | POA: Diagnosis not present

## 2021-10-31 DIAGNOSIS — Z8553 Personal history of malignant neoplasm of renal pelvis: Secondary | ICD-10-CM | POA: Diagnosis not present

## 2021-10-31 DIAGNOSIS — Z6821 Body mass index (BMI) 21.0-21.9, adult: Secondary | ICD-10-CM | POA: Diagnosis not present

## 2021-11-05 DIAGNOSIS — C7951 Secondary malignant neoplasm of bone: Secondary | ICD-10-CM | POA: Diagnosis not present

## 2021-11-05 DIAGNOSIS — C651 Malignant neoplasm of right renal pelvis: Secondary | ICD-10-CM | POA: Diagnosis not present

## 2021-11-05 DIAGNOSIS — G893 Neoplasm related pain (acute) (chronic): Secondary | ICD-10-CM | POA: Diagnosis not present

## 2021-11-12 DIAGNOSIS — C786 Secondary malignant neoplasm of retroperitoneum and peritoneum: Secondary | ICD-10-CM | POA: Diagnosis not present

## 2021-11-12 DIAGNOSIS — C651 Malignant neoplasm of right renal pelvis: Secondary | ICD-10-CM | POA: Diagnosis not present

## 2021-11-12 DIAGNOSIS — R1900 Intra-abdominal and pelvic swelling, mass and lump, unspecified site: Secondary | ICD-10-CM | POA: Diagnosis not present

## 2021-11-19 DIAGNOSIS — C651 Malignant neoplasm of right renal pelvis: Secondary | ICD-10-CM | POA: Diagnosis not present

## 2021-11-19 DIAGNOSIS — C7951 Secondary malignant neoplasm of bone: Secondary | ICD-10-CM | POA: Diagnosis not present

## 2021-11-19 DIAGNOSIS — G893 Neoplasm related pain (acute) (chronic): Secondary | ICD-10-CM | POA: Diagnosis not present

## 2021-11-22 DIAGNOSIS — C679 Malignant neoplasm of bladder, unspecified: Secondary | ICD-10-CM | POA: Diagnosis not present

## 2021-11-22 DIAGNOSIS — C7951 Secondary malignant neoplasm of bone: Secondary | ICD-10-CM | POA: Diagnosis not present

## 2021-11-22 DIAGNOSIS — C689 Malignant neoplasm of urinary organ, unspecified: Secondary | ICD-10-CM | POA: Diagnosis not present

## 2021-11-25 DIAGNOSIS — C651 Malignant neoplasm of right renal pelvis: Secondary | ICD-10-CM | POA: Diagnosis not present

## 2021-11-25 DIAGNOSIS — G893 Neoplasm related pain (acute) (chronic): Secondary | ICD-10-CM | POA: Diagnosis not present

## 2021-11-25 DIAGNOSIS — C7951 Secondary malignant neoplasm of bone: Secondary | ICD-10-CM | POA: Diagnosis not present

## 2021-11-26 DIAGNOSIS — C7951 Secondary malignant neoplasm of bone: Secondary | ICD-10-CM | POA: Diagnosis not present

## 2021-11-26 DIAGNOSIS — Z905 Acquired absence of kidney: Secondary | ICD-10-CM | POA: Diagnosis not present

## 2021-11-26 DIAGNOSIS — C651 Malignant neoplasm of right renal pelvis: Secondary | ICD-10-CM | POA: Diagnosis not present

## 2021-11-26 DIAGNOSIS — G893 Neoplasm related pain (acute) (chronic): Secondary | ICD-10-CM | POA: Diagnosis not present

## 2021-12-01 DIAGNOSIS — J449 Chronic obstructive pulmonary disease, unspecified: Secondary | ICD-10-CM | POA: Diagnosis not present

## 2021-12-03 DIAGNOSIS — Z79899 Other long term (current) drug therapy: Secondary | ICD-10-CM | POA: Diagnosis not present

## 2021-12-03 DIAGNOSIS — C651 Malignant neoplasm of right renal pelvis: Secondary | ICD-10-CM | POA: Diagnosis not present

## 2021-12-03 DIAGNOSIS — M899 Disorder of bone, unspecified: Secondary | ICD-10-CM | POA: Diagnosis not present

## 2021-12-03 DIAGNOSIS — Z0283 Encounter for blood-alcohol and blood-drug test: Secondary | ICD-10-CM | POA: Diagnosis not present

## 2021-12-03 DIAGNOSIS — G893 Neoplasm related pain (acute) (chronic): Secondary | ICD-10-CM | POA: Diagnosis not present

## 2021-12-04 DIAGNOSIS — C651 Malignant neoplasm of right renal pelvis: Secondary | ICD-10-CM | POA: Diagnosis not present

## 2021-12-04 DIAGNOSIS — G893 Neoplasm related pain (acute) (chronic): Secondary | ICD-10-CM | POA: Diagnosis not present

## 2021-12-05 DIAGNOSIS — J9601 Acute respiratory failure with hypoxia: Secondary | ICD-10-CM | POA: Diagnosis not present

## 2021-12-05 DIAGNOSIS — N3289 Other specified disorders of bladder: Secondary | ICD-10-CM | POA: Diagnosis not present

## 2021-12-05 DIAGNOSIS — C641 Malignant neoplasm of right kidney, except renal pelvis: Secondary | ICD-10-CM | POA: Diagnosis not present

## 2021-12-05 DIAGNOSIS — R590 Localized enlarged lymph nodes: Secondary | ICD-10-CM | POA: Diagnosis not present

## 2021-12-05 DIAGNOSIS — J90 Pleural effusion, not elsewhere classified: Secondary | ICD-10-CM | POA: Diagnosis not present

## 2021-12-05 DIAGNOSIS — C651 Malignant neoplasm of right renal pelvis: Secondary | ICD-10-CM | POA: Diagnosis not present

## 2021-12-05 DIAGNOSIS — D631 Anemia in chronic kidney disease: Secondary | ICD-10-CM | POA: Diagnosis not present

## 2021-12-05 DIAGNOSIS — Z79899 Other long term (current) drug therapy: Secondary | ICD-10-CM | POA: Diagnosis not present

## 2021-12-05 DIAGNOSIS — R6521 Severe sepsis with septic shock: Secondary | ICD-10-CM | POA: Diagnosis not present

## 2021-12-05 DIAGNOSIS — Z515 Encounter for palliative care: Secondary | ICD-10-CM | POA: Diagnosis not present

## 2021-12-05 DIAGNOSIS — J8410 Pulmonary fibrosis, unspecified: Secondary | ICD-10-CM | POA: Diagnosis not present

## 2021-12-05 DIAGNOSIS — Z20822 Contact with and (suspected) exposure to covid-19: Secondary | ICD-10-CM | POA: Diagnosis not present

## 2021-12-05 DIAGNOSIS — E8770 Fluid overload, unspecified: Secondary | ICD-10-CM | POA: Diagnosis not present

## 2021-12-05 DIAGNOSIS — D649 Anemia, unspecified: Secondary | ICD-10-CM | POA: Diagnosis not present

## 2021-12-05 DIAGNOSIS — A419 Sepsis, unspecified organism: Secondary | ICD-10-CM | POA: Diagnosis not present

## 2021-12-05 DIAGNOSIS — F17220 Nicotine dependence, chewing tobacco, uncomplicated: Secondary | ICD-10-CM | POA: Diagnosis not present

## 2021-12-05 DIAGNOSIS — J439 Emphysema, unspecified: Secondary | ICD-10-CM | POA: Diagnosis not present

## 2021-12-05 DIAGNOSIS — N183 Chronic kidney disease, stage 3 unspecified: Secondary | ICD-10-CM | POA: Diagnosis not present

## 2021-12-05 DIAGNOSIS — K219 Gastro-esophageal reflux disease without esophagitis: Secondary | ICD-10-CM | POA: Diagnosis not present

## 2021-12-05 DIAGNOSIS — J9811 Atelectasis: Secondary | ICD-10-CM | POA: Diagnosis not present

## 2021-12-05 DIAGNOSIS — G893 Neoplasm related pain (acute) (chronic): Secondary | ICD-10-CM | POA: Diagnosis not present

## 2021-12-05 DIAGNOSIS — E871 Hypo-osmolality and hyponatremia: Secondary | ICD-10-CM | POA: Diagnosis not present

## 2021-12-05 DIAGNOSIS — J984 Other disorders of lung: Secondary | ICD-10-CM | POA: Diagnosis not present

## 2021-12-05 DIAGNOSIS — R531 Weakness: Secondary | ICD-10-CM | POA: Diagnosis not present

## 2021-12-05 DIAGNOSIS — R918 Other nonspecific abnormal finding of lung field: Secondary | ICD-10-CM | POA: Diagnosis not present

## 2021-12-05 DIAGNOSIS — N179 Acute kidney failure, unspecified: Secondary | ICD-10-CM | POA: Diagnosis not present

## 2021-12-05 DIAGNOSIS — N1832 Chronic kidney disease, stage 3b: Secondary | ICD-10-CM | POA: Diagnosis not present

## 2021-12-05 DIAGNOSIS — G9341 Metabolic encephalopathy: Secondary | ICD-10-CM | POA: Diagnosis not present

## 2021-12-05 DIAGNOSIS — K449 Diaphragmatic hernia without obstruction or gangrene: Secondary | ICD-10-CM | POA: Diagnosis not present

## 2021-12-06 DIAGNOSIS — G9341 Metabolic encephalopathy: Secondary | ICD-10-CM | POA: Diagnosis not present

## 2021-12-06 DIAGNOSIS — D649 Anemia, unspecified: Secondary | ICD-10-CM | POA: Diagnosis not present

## 2021-12-06 DIAGNOSIS — J9601 Acute respiratory failure with hypoxia: Secondary | ICD-10-CM | POA: Diagnosis not present

## 2021-12-06 DIAGNOSIS — C651 Malignant neoplasm of right renal pelvis: Secondary | ICD-10-CM | POA: Diagnosis not present

## 2021-12-06 DIAGNOSIS — E871 Hypo-osmolality and hyponatremia: Secondary | ICD-10-CM | POA: Diagnosis not present

## 2021-12-06 DIAGNOSIS — Z515 Encounter for palliative care: Secondary | ICD-10-CM | POA: Diagnosis not present

## 2021-12-06 DIAGNOSIS — A419 Sepsis, unspecified organism: Secondary | ICD-10-CM | POA: Diagnosis not present

## 2021-12-06 DIAGNOSIS — R6521 Severe sepsis with septic shock: Secondary | ICD-10-CM | POA: Diagnosis not present

## 2021-12-06 DIAGNOSIS — G893 Neoplasm related pain (acute) (chronic): Secondary | ICD-10-CM | POA: Diagnosis not present

## 2021-12-06 DIAGNOSIS — C7951 Secondary malignant neoplasm of bone: Secondary | ICD-10-CM | POA: Diagnosis not present

## 2021-12-06 DIAGNOSIS — N179 Acute kidney failure, unspecified: Secondary | ICD-10-CM | POA: Diagnosis not present

## 2021-12-23 DEATH — deceased
